# Patient Record
Sex: Male | Born: 1950 | Race: Black or African American | Hispanic: No | Marital: Married | Smoking: Former smoker
Health system: Southern US, Community
[De-identification: ages and names within clinical notes are randomized; demographics above are authoritative.]

## PROBLEM LIST (undated history)

## (undated) DIAGNOSIS — R0609 Other forms of dyspnea: Secondary | ICD-10-CM

## (undated) DIAGNOSIS — I10 Essential (primary) hypertension: Secondary | ICD-10-CM

## (undated) DIAGNOSIS — R058 Other specified cough: Secondary | ICD-10-CM

## (undated) DIAGNOSIS — R7303 Prediabetes: Secondary | ICD-10-CM

## (undated) DIAGNOSIS — M1990 Unspecified osteoarthritis, unspecified site: Secondary | ICD-10-CM

## (undated) DIAGNOSIS — R05 Cough: Secondary | ICD-10-CM

## (undated) DIAGNOSIS — K219 Gastro-esophageal reflux disease without esophagitis: Secondary | ICD-10-CM

## (undated) DIAGNOSIS — E119 Type 2 diabetes mellitus without complications: Secondary | ICD-10-CM

## (undated) DIAGNOSIS — G4730 Sleep apnea, unspecified: Secondary | ICD-10-CM

## (undated) DIAGNOSIS — R430 Anosmia: Secondary | ICD-10-CM

## (undated) DIAGNOSIS — E042 Nontoxic multinodular goiter: Secondary | ICD-10-CM

## (undated) DIAGNOSIS — I639 Cerebral infarction, unspecified: Secondary | ICD-10-CM

## (undated) DIAGNOSIS — C61 Malignant neoplasm of prostate: Secondary | ICD-10-CM

## (undated) DIAGNOSIS — C801 Malignant (primary) neoplasm, unspecified: Secondary | ICD-10-CM

## (undated) HISTORY — PX: FRACTURE SURGERY: SHX138

## (undated) HISTORY — PX: PARATHYROIDECTOMY: SHX19

---

## 2009-09-18 HISTORY — PX: BACK SURGERY: SHX140

## 2010-09-18 DIAGNOSIS — C801 Malignant (primary) neoplasm, unspecified: Secondary | ICD-10-CM

## 2010-09-18 HISTORY — DX: Malignant (primary) neoplasm, unspecified: C80.1

## 2014-02-04 ENCOUNTER — Other Ambulatory Visit: Payer: Self-pay | Admitting: Orthopedic Surgery

## 2014-02-04 NOTE — H&P (Signed)
Noah Jensen is an 63 y.o. male.   Chief Complaint: back and leg pain HPI:The patient is a 63 year old male who presents today for follow up of their back. The patient is being followed for their low back symptoms. They are now 2 years, 2 months out from injury (DOI 11/16/2011). Symptoms reported today include: pain, pain with lying and pain with standing. The patient states that they are doing poorly. Current treatment includes: activity modification and pain medications (prn). The following medication has been used for pain control: gabapentin. The patient reports their current pain level to be 6-7 / 10. The patient presents today following MRI. Note for "Follow-up back": The patient is currently out of work.  Noah Jensen is having right lower extremity radicular pain into the big toe. He said surgery has been approved. He has had a history of a lumbar decompression at L4-5 and L5-S1. He had some back but predominant leg pain. He is retired now. He would like to proceed with surgical intervention. He has had his lumbar MRI. 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 There is an asymmetric effect on the L5-S1 nerve root. He has foraminal stenosis noted.  He is here with his wife. He did get relief when he put his foot up on a step stool and leans forward.   No past medical history on file.  No past surgical history on file.  No family history on file. Social History:  has no tobacco, alcohol, and drug history on file.  Allergies: Allergies not on file   (Not in a hospital admission)  No results found for this or any previous visit (from the past 48 hour(s)). No results found.  Review of Systems  Constitutional: Negative.   HENT: Negative.   Eyes: Negative.   Respiratory: Negative.   Cardiovascular: Negative.   Gastrointestinal: Negative.   Genitourinary: Negative.   Musculoskeletal: Positive for back pain.  Skin: Negative.   Neurological: Positive for sensory change and focal  weakness.  Psychiatric/Behavioral: Negative.     There were no vitals taken for this visit. Physical Exam  Constitutional: He is oriented to person, place, and time. He appears well-developed and well-nourished.  HENT:  Head: Normocephalic and atraumatic.  Eyes: Conjunctivae and EOM are normal. Pupils are equal, round, and reactive to light.  Neck: Normal range of motion. Neck supple.  Cardiovascular: Normal rate and regular rhythm.   Respiratory: Effort normal and breath sounds normal.  GI: Soft. Bowel sounds are normal.  Musculoskeletal:  He is in mild distress. He is standing with his foot up on a stool.  Lumbar spine exam reveals no evidence of soft tissue swelling, ecchymosis or deformity. The abdomen is soft and nontender. Nontender over the trochanters. No cellulitis or lymphadenopathy.  Straight leg raise produces buttock, thigh and calf pain. He has EHL weakness. Altered sensation in the L5 dermatome. There is some discomfort in the lumbosacral junction with forward flexion and extension. Patient is normoreflexic. There is no Babinski or clonus. Sensory exam is intact to light touch. The patient has good distal pulses. No DVT. No pain and normal range of motion without instability of the hips, knees and ankles.  Neurological: He is alert and oriented to person, place, and time. He has normal reflexes.  Skin: Skin is warm and dry.  Psychiatric: He has a normal mood and affect.    He has had his lumbar MRI. 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 There is an  asymmetric effect on the L5-S1 nerve root. He has foraminal stenosis noted.  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 History of lumbar decompression at L4-5 and L5-S1 with slight hypertrophy.  We discussed options at this point to include:  1. Living with the symptoms. 2. Consider a foraminotomy, partial full fasciectomy with a lateral  mass fusion versus two and possibly three level fusion.  We discussed that he is not challenged by his back pain, it is predominantly his leg pain. He had an acute injury. This was confirmed by a Nerve Conduction Study as opposed to chronic. He does have foraminal stenosis which would require a partial fasciectomy to full. I described that in detail.  I had an extensive discussion of the risks and benefits of the lumbar decompression with the patient including bleeding, infection, damage to neurovascular structures, epidural fibrosis, CSF leak requiring repair. We also discussed increase in pain, adjacent segment disease, recurrent disc herniation, need for future surgery including repeat decompression and/or fusion. We also discussed risks of postoperative hematoma, paralysis, anesthetic complications including DVT, PE, death, cardiopulmonary dysfunction. In addition, the perioperative and postoperative courses were discussed in detail including the rehabilitative time and return to functional activity and work. I provided the patient with an illustrated handout and utilized the appropriate surgical models.  I indicated with the duration of time over two years he may undergo a decompression without avail or worsening symptoms. Considering the slight improvement that he has had , the is ability to compensate and unload the nerve root may improve his prognosis.  We discussed the possibility of a decompression for a multi-level fusion in the future. We spent considerable time discussing this. We will proceed accordingly. He has otherwise been healthy. Again,there has been considerable time from the initial indication. We may consider a repeat Nerve Conduction Study at some point.  Plan microlumbar decompression L5-S1 right, possible lateral mass fusion  Jaclyn M. Bissell PA-C for Dr. Tonita Cong 02/04/2014, 8:42 PM

## 2014-02-05 ENCOUNTER — Other Ambulatory Visit: Payer: Self-pay | Admitting: Orthopedic Surgery

## 2014-02-05 ENCOUNTER — Other Ambulatory Visit (HOSPITAL_COMMUNITY): Payer: Self-pay | Admitting: Specialist

## 2014-02-05 ENCOUNTER — Encounter (HOSPITAL_COMMUNITY): Payer: Self-pay | Admitting: Pharmacy Technician

## 2014-02-05 NOTE — Patient Instructions (Addendum)
Your procedure is scheduled on:  02/11/14  [facility]  Report to [place] at 0800      AM.  Call this number if you have problems the morning of surgery: [phone]        Do not eat food  Or drink :After Midnight. Tuesday NIGHT   Take these medicines the morning of surgery with A SIP OF WATER: FINASTERIDE,NEXIUM DO NOT TAKE ANY METFORMIN MORNING OF SURGERY  .  Contacts, dentures or partial plates, or metal hairpins  can not be worn to surgery. Your family will be responsible for glasses, dentures, hearing aides while you are in surgery  Leave suitcase in the car. After surgery it may be brought to your room.  For patients admitted to the hospital, checkout time is 11:00 AM day of  discharge.                DO NOT WEAR JEWELRY, LOTIONS, POWDERS, OR PERFUMES.  WOMEN-- DO NOT SHAVE LEGS OR UNDERARMS FOR 48 HOURS BEFORE SHOWERS. MEN MAY SHAVE FACE.  Patients discharged the day of surgery will not be allowed to drive home. IF going home the day of surgery, you must have a driver and someone to stay with you for the first 24 hours                                                                  [facility] - Preparing for Surgery Before surgery, you can play an important role.  Because skin is not sterile, your skin needs to be as free of germs as possible.  You can reduce the number of germs on your skin by washing with CHG (chlorahexidine gluconate) soap before surgery.  CHG is an antiseptic cleaner which kills germs and bonds with the skin to continue killing germs even after washing. Please DO NOT use if you have an allergy to CHG or antibacterial soaps.  If your skin becomes reddened/irritated stop using the CHG and inform your nurse when you arrive at Short Stay. Do not shave (including legs and underarms) for at least 48 hours prior to the first CHG shower.  You may shave your face/neck. Please follow these instructions carefully:  1.  Shower with CHG Soap the  night before surgery and the  morning of Surgery.  2.  If you choose to wash your hair, wash your hair first as usual with your  normal  shampoo.  3.  After you shampoo, rinse your hair and body thoroughly to remove the  shampoo.                           4.  Use CHG as you would any other liquid soap.  You can apply chg directly  to the skin and wash                       Gently with a scrungie or clean washcloth.  5.  Apply the CHG Soap to your body ONLY FROM THE NECK DOWN.   Do not use on face/ open  Wound or open sores. Avoid contact with eyes, ears mouth and genitals (private parts).                       Wash face,  Genitals (private parts) with your normal soap.             6.  Wash thoroughly, paying special attention to the area where your surgery  will be performed.  7.  Thoroughly rinse your body with warm water from the neck down.  8.  DO NOT shower/wash with your normal soap after using and rinsing off  the CHG Soap.                9.  Pat yourself dry with a clean towel.            10.  Wear clean pajamas.            11.  Place clean sheets on your bed the night of your first shower and do not  sleep with pets. Day of Surgery : Do not apply any lotions/deodorants the morning of surgery.  Please wear clean clothes to the hospital/surgery center.  FAILURE TO FOLLOW THESE INSTRUCTIONS MAY RESULT IN THE CANCELLATION OF YOUR SURGERY PATIENT SIGNATURE_________________________________  NURSE SIGNATURE__________________________________  ________________________________________________________________________   Noah Jensen  An incentive spirometer is a tool that can help keep your lungs clear and active. This tool measures how well you are filling your lungs with each breath. Taking long deep breaths may help reverse or decrease the chance of developing breathing (pulmonary) problems (especially infection) following:  A long period of time when you  are unable to move or be active. BEFORE THE PROCEDURE   If the spirometer includes an indicator to show your best effort, your nurse or respiratory therapist will set it to a desired goal.  If possible, sit up straight or lean slightly forward. Try not to slouch.  Hold the incentive spirometer in an upright position. INSTRUCTIONS FOR USE  1. Sit on the edge of your bed if possible, or sit up as far as you can in bed or on a chair. 2. Hold the incentive spirometer in an upright position. 3. Breathe out normally. 4. Place the mouthpiece in your mouth and seal your lips tightly around it. 5. Breathe in slowly and as deeply as possible, raising the piston or the ball toward the top of the column. 6. Hold your breath for 3-5 seconds or for as long as possible. Allow the piston or ball to fall to the bottom of the column. 7. Remove the mouthpiece from your mouth and breathe out normally. 8. Rest for a few seconds and repeat Steps 1 through 7 at least 10 times every 1-2 hours when you are awake. Take your time and take a few normal breaths between deep breaths. 9. The spirometer may include an indicator to show your best effort. Use the indicator as a goal to work toward during each repetition. 10. After each set of 10 deep breaths, practice coughing to be sure your lungs are clear. If you have an incision (the cut made at the time of surgery), support your incision when coughing by placing a pillow or rolled up towels firmly against it. Once you are able to get out of bed, walk around indoors and cough well. You may stop using the incentive spirometer when instructed by your caregiver.  RISKS AND COMPLICATIONS  Take your time so you do not get  dizzy or light-headed.  If you are in pain, you may need to take or ask for pain medication before doing incentive spirometry. It is harder to take a deep breath if you are having pain. AFTER USE  Rest and breathe slowly and easily.  It can be helpful to  keep track of a log of your progress. Your caregiver can provide you with a simple table to help with this. If you are using the spirometer at home, follow these instructions: [place] IF:   You are having difficultly using the spirometer.  You have trouble using the spirometer as often as instructed.  Your pain medication is not giving enough relief while using the spirometer.  You develop fever of 100.5 F (38.1 C) or higher. SEEK IMMEDIATE MEDICAL CARE IF:   You cough up bloody sputum that had not been present before.  You develop fever of 102 F (38.9 C) or greater.  You develop worsening pain at or near the incision site. MAKE SURE YOU:   Understand these instructions.  Will watch your condition.  Will get help right away if you are not doing well or get worse. Document Released: 01/15/2007 Document Revised: 11/27/2011 Document Reviewed: 03/18/2007 [facility] Patient Information 2014 [place], [place].   ________________________________________________________________________

## 2014-02-05 NOTE — Progress Notes (Signed)
Lacie Draft, PA  - Please enter preop orders in Epic for Noah Jensen - surg is 5/27 - pt is coming to [facility] tomorrow Friday 5/22 for preop.  Thanks.

## 2014-02-06 ENCOUNTER — Ambulatory Visit (HOSPITAL_COMMUNITY)
Admission: RE | Admit: 2014-02-06 | Discharge: 2014-02-06 | Disposition: A | Discharge status: Home / Self Care | Payer: Worker's Compensation | Source: Ambulatory Visit | Attending: Orthopedic Surgery | Admitting: Orthopedic Surgery

## 2014-02-06 ENCOUNTER — Encounter (HOSPITAL_COMMUNITY)
Admission: RE | Admit: 2014-02-06 | Discharge: 2014-02-06 | Disposition: A | Discharge status: Home / Self Care | Payer: Worker's Compensation | Source: Ambulatory Visit | Attending: Specialist | Admitting: Specialist

## 2014-02-06 ENCOUNTER — Encounter (HOSPITAL_COMMUNITY): Payer: Self-pay

## 2014-02-06 ENCOUNTER — Encounter (INDEPENDENT_AMBULATORY_CARE_PROVIDER_SITE_OTHER): Payer: Self-pay

## 2014-02-06 DIAGNOSIS — Z01818 Encounter for other preprocedural examination: Secondary | ICD-10-CM | POA: Insufficient documentation

## 2014-02-06 DIAGNOSIS — M47817 Spondylosis without myelopathy or radiculopathy, lumbosacral region: Secondary | ICD-10-CM | POA: Insufficient documentation

## 2014-02-06 DIAGNOSIS — Z01810 Encounter for preprocedural cardiovascular examination: Secondary | ICD-10-CM | POA: Insufficient documentation

## 2014-02-06 DIAGNOSIS — R058 Other specified cough: Secondary | ICD-10-CM

## 2014-02-06 DIAGNOSIS — J984 Other disorders of lung: Secondary | ICD-10-CM | POA: Insufficient documentation

## 2014-02-06 DIAGNOSIS — M48061 Spinal stenosis, lumbar region without neurogenic claudication: Secondary | ICD-10-CM | POA: Insufficient documentation

## 2014-02-06 DIAGNOSIS — Z01812 Encounter for preprocedural laboratory examination: Secondary | ICD-10-CM | POA: Insufficient documentation

## 2014-02-06 DIAGNOSIS — Z87891 Personal history of nicotine dependence: Secondary | ICD-10-CM | POA: Insufficient documentation

## 2014-02-06 HISTORY — DX: Cough: R05

## 2014-02-06 HISTORY — DX: Malignant (primary) neoplasm, unspecified: C80.1

## 2014-02-06 HISTORY — DX: Other specified cough: R05.8

## 2014-02-06 HISTORY — DX: Prediabetes: R73.03

## 2014-02-06 HISTORY — DX: Unspecified osteoarthritis, unspecified site: M19.90

## 2014-02-06 HISTORY — DX: Gastro-esophageal reflux disease without esophagitis: K21.9

## 2014-02-06 LAB — BASIC METABOLIC PANEL
BUN: 16 mg/dL (ref 6–23)
CALCIUM: 10.5 mg/dL (ref 8.4–10.5)
CHLORIDE: 100 meq/L (ref 96–112)
CO2: 28 mEq/L (ref 19–32)
CREATININE: 1.1 mg/dL (ref 0.50–1.35)
GFR calc Af Amer: 82 mL/min — ABNORMAL LOW (ref >90)
GFR calc non Af Amer: 71 mL/min — ABNORMAL LOW (ref >90)
Glucose, Bld: 120 mg/dL — ABNORMAL HIGH (ref 70–99)
Potassium: 4.1 mEq/L (ref 3.7–5.3)
Sodium: 142 mEq/L (ref 137–147)

## 2014-02-06 LAB — CBC
HCT: 46.1 % (ref 39.0–52.0)
Hemoglobin: 15.6 g/dL (ref 13.0–17.0)
MCH: 30.1 pg (ref 26.0–34.0)
MCHC: 33.8 g/dL (ref 30.0–36.0)
MCV: 89 fL (ref 78.0–100.0)
PLATELETS: 199 10*3/uL (ref 150–400)
RBC: 5.18 MIL/uL (ref 4.22–5.81)
RDW: 13.4 % (ref 11.5–15.5)
WBC: 5.4 10*3/uL (ref 4.0–10.5)

## 2014-02-06 LAB — SURGICAL PCR SCREEN
MRSA, PCR: POSITIVE — AB
Staphylococcus aureus: POSITIVE — AB

## 2014-02-06 NOTE — Progress Notes (Signed)
Faxed positive MRSA  PCR screen to Dr Tonita Cong via [facility]

## 2014-02-06 NOTE — Progress Notes (Signed)
02/06/14 0928  OBSTRUCTIVE SLEEP APNEA  Have you ever been diagnosed with sleep apnea through a sleep study? No  Do you snore loudly (loud enough to be heard through closed doors)?  0  Do you often feel tired, fatigued, or sleepy during the daytime? 1  Has anyone observed you stop breathing during your sleep? 0  Do you have, or are you being treated for high blood pressure? 0  BMI more than 35 kg/m2? 0  Age over 62 years old? 1  Neck circumference greater than 40 cm/16 inches? 1  Gender: 1  Obstructive Sleep Apnea Score 4  Score 4 or greater  Results sent to PCP

## 2014-02-10 ENCOUNTER — Other Ambulatory Visit: Payer: Self-pay | Admitting: Orthopedic Surgery

## 2014-02-11 ENCOUNTER — Ambulatory Visit (HOSPITAL_COMMUNITY): Payer: Worker's Compensation

## 2014-02-11 ENCOUNTER — Ambulatory Visit (HOSPITAL_COMMUNITY): Payer: Worker's Compensation | Admitting: Anesthesiology

## 2014-02-11 ENCOUNTER — Encounter (HOSPITAL_COMMUNITY)
Admission: RE | Disposition: A | Discharge status: Home Health | Payer: Self-pay | Source: Ambulatory Visit | Attending: Specialist

## 2014-02-11 ENCOUNTER — Ambulatory Visit (HOSPITAL_COMMUNITY)
Admission: RE | Admit: 2014-02-11 | Discharge: 2014-02-12 | Disposition: A | Discharge status: Home Health | Payer: Worker's Compensation | Source: Ambulatory Visit | Attending: Specialist | Admitting: Specialist

## 2014-02-11 ENCOUNTER — Encounter (HOSPITAL_COMMUNITY): Payer: Self-pay | Admitting: *Deleted

## 2014-02-11 ENCOUNTER — Encounter (HOSPITAL_COMMUNITY): Payer: Worker's Compensation | Admitting: Anesthesiology

## 2014-02-11 DIAGNOSIS — K219 Gastro-esophageal reflux disease without esophagitis: Secondary | ICD-10-CM | POA: Insufficient documentation

## 2014-02-11 DIAGNOSIS — M51379 Other intervertebral disc degeneration, lumbosacral region without mention of lumbar back pain or lower extremity pain: Secondary | ICD-10-CM | POA: Insufficient documentation

## 2014-02-11 DIAGNOSIS — M5416 Radiculopathy, lumbar region: Secondary | ICD-10-CM

## 2014-02-11 DIAGNOSIS — M48061 Spinal stenosis, lumbar region without neurogenic claudication: Secondary | ICD-10-CM | POA: Diagnosis present

## 2014-02-11 DIAGNOSIS — M5137 Other intervertebral disc degeneration, lumbosacral region: Secondary | ICD-10-CM | POA: Insufficient documentation

## 2014-02-11 DIAGNOSIS — C61 Malignant neoplasm of prostate: Secondary | ICD-10-CM | POA: Insufficient documentation

## 2014-02-11 DIAGNOSIS — Z87891 Personal history of nicotine dependence: Secondary | ICD-10-CM | POA: Insufficient documentation

## 2014-02-11 DIAGNOSIS — M5126 Other intervertebral disc displacement, lumbar region: Secondary | ICD-10-CM | POA: Insufficient documentation

## 2014-02-11 DIAGNOSIS — E119 Type 2 diabetes mellitus without complications: Secondary | ICD-10-CM | POA: Insufficient documentation

## 2014-02-11 HISTORY — PX: LUMBAR LAMINECTOMY/DECOMPRESSION MICRODISCECTOMY: SHX5026

## 2014-02-11 HISTORY — DX: Type 2 diabetes mellitus without complications: E11.9

## 2014-02-11 LAB — GLUCOSE, CAPILLARY
GLUCOSE-CAPILLARY: 108 mg/dL — AB (ref 70–99)
Glucose-Capillary: 104 mg/dL — ABNORMAL HIGH (ref 70–99)
Glucose-Capillary: 81 mg/dL (ref 70–99)
Glucose-Capillary: 151 mg/dL — ABNORMAL HIGH (ref 70–99)

## 2014-02-11 LAB — ABO/RH: ABO/RH(D): B POS

## 2014-02-11 LAB — TYPE AND SCREEN
ABO/RH(D): B POS
ANTIBODY SCREEN: NEGATIVE

## 2014-02-11 SURGERY — LUMBAR LAMINECTOMY/DECOMPRESSION MICRODISCECTOMY 1 LEVEL
Anesthesia: General | Site: Back | Laterality: Right

## 2014-02-11 MED ORDER — SODIUM CHLORIDE 0.9 % IJ SOLN: 3.0000 mL | INTRAMUSCULAR | Status: DC | PRN

## 2014-02-11 MED ORDER — SODIUM CHLORIDE 0.9 % IJ SOLN
INTRAMUSCULAR | Status: AC
  Filled 2014-02-11: qty 10

## 2014-02-11 MED ORDER — FENTANYL CITRATE 0.05 MG/ML IJ SOLN
INTRAMUSCULAR | Status: AC
  Filled 2014-02-11: qty 2

## 2014-02-11 MED ORDER — NEOSTIGMINE METHYLSULFATE 10 MG/10ML IV SOLN
INTRAVENOUS | Status: DC | PRN
  Administered 2014-02-11: 5 mg via INTRAVENOUS

## 2014-02-11 MED ORDER — OXYCODONE-ACETAMINOPHEN 5-325 MG PO TABS
1.0000 | ORAL_TABLET | ORAL | Status: DC | PRN
  Administered 2014-02-11 – 2014-02-12 (×5): 2 via ORAL
  Filled 2014-02-11 (×5): qty 2

## 2014-02-11 MED ORDER — LACTATED RINGERS IV SOLN: INTRAVENOUS | Status: DC

## 2014-02-11 MED ORDER — LACTATED RINGERS IV SOLN
INTRAVENOUS | Status: DC | PRN
  Administered 2014-02-11 (×3): via INTRAVENOUS

## 2014-02-11 MED ORDER — BUPIVACAINE-EPINEPHRINE 0.5% -1:200000 IJ SOLN
INTRAMUSCULAR | Status: DC | PRN
  Administered 2014-02-11: 10 mL

## 2014-02-11 MED ORDER — EPHEDRINE SULFATE 50 MG/ML IJ SOLN
INTRAMUSCULAR | Status: AC
  Filled 2014-02-11: qty 1

## 2014-02-11 MED ORDER — HYDROMORPHONE HCL PF 1 MG/ML IJ SOLN
INTRAMUSCULAR | Status: AC
  Filled 2014-02-11: qty 1

## 2014-02-11 MED ORDER — SODIUM CHLORIDE 0.9 % IV SOLN: 250.0000 mL | INTRAVENOUS | Status: DC

## 2014-02-11 MED ORDER — ACETAMINOPHEN 10 MG/ML IV SOLN
INTRAVENOUS | Status: DC | PRN
  Administered 2014-02-11: 1000 mg via INTRAVENOUS

## 2014-02-11 MED ORDER — PHENOL 1.4 % MT LIQD: 1.0000 | OROMUCOSAL | Status: DC | PRN

## 2014-02-11 MED ORDER — THROMBIN 5000 UNITS EX SOLR
CUTANEOUS | Status: AC
  Filled 2014-02-11: qty 10000

## 2014-02-11 MED ORDER — CEFAZOLIN SODIUM-DEXTROSE 2-3 GM-% IV SOLR
2.0000 g | INTRAVENOUS | Status: AC
  Administered 2014-02-11: 2 g via INTRAVENOUS

## 2014-02-11 MED ORDER — GLYCOPYRROLATE 0.2 MG/ML IJ SOLN
INTRAMUSCULAR | Status: AC
  Filled 2014-02-11: qty 3

## 2014-02-11 MED ORDER — MIDAZOLAM HCL 2 MG/2ML IJ SOLN
INTRAMUSCULAR | Status: AC
  Filled 2014-02-11: qty 2

## 2014-02-11 MED ORDER — HYDROMORPHONE HCL PF 1 MG/ML IJ SOLN
0.2500 mg | INTRAMUSCULAR | Status: DC | PRN
  Administered 2014-02-11 (×4): 0.5 mg via INTRAVENOUS

## 2014-02-11 MED ORDER — SODIUM CHLORIDE 0.9 % IR SOLN
Status: DC | PRN
  Administered 2014-02-11: 11:00:00

## 2014-02-11 MED ORDER — VITAMIN E 45 MG (100 UNIT) PO CAPS: 1000.0000 [IU] | ORAL_CAPSULE | Freq: Every day | ORAL | Status: DC

## 2014-02-11 MED ORDER — ACETAMINOPHEN 650 MG RE SUPP: 650.0000 mg | RECTAL | Status: DC | PRN

## 2014-02-11 MED ORDER — DEXAMETHASONE SODIUM PHOSPHATE 4 MG/ML IJ SOLN
INTRAMUSCULAR | Status: DC | PRN
  Administered 2014-02-11: 10 mg via INTRAVENOUS

## 2014-02-11 MED ORDER — ONDANSETRON HCL 4 MG/2ML IJ SOLN
4.0000 mg | INTRAMUSCULAR | Status: DC | PRN
  Filled 2014-02-11: qty 2

## 2014-02-11 MED ORDER — SODIUM CHLORIDE 0.9 % IJ SOLN
3.0000 mL | Freq: Two times a day (BID) | INTRAMUSCULAR | Status: DC
  Administered 2014-02-12: 3 mL via INTRAVENOUS

## 2014-02-11 MED ORDER — PHENYLEPHRINE HCL 10 MG/ML IJ SOLN
INTRAMUSCULAR | Status: DC | PRN
  Administered 2014-02-11: 20 ug via INTRAVENOUS
  Administered 2014-02-11 (×3): 40 ug via INTRAVENOUS
  Administered 2014-02-11: 20 ug via INTRAVENOUS
  Administered 2014-02-11 (×2): 40 ug via INTRAVENOUS

## 2014-02-11 MED ORDER — LACTATED RINGERS IV SOLN
INTRAVENOUS | Status: DC
  Administered 2014-02-11: 1000 mL via INTRAVENOUS

## 2014-02-11 MED ORDER — HYDROCODONE-ACETAMINOPHEN 5-325 MG PO TABS: 1.0000 | ORAL_TABLET | ORAL | Status: DC | PRN

## 2014-02-11 MED ORDER — DOCUSATE SODIUM 100 MG PO CAPS
100.0000 mg | ORAL_CAPSULE | Freq: Two times a day (BID) | ORAL | Status: DC
Start: 2014-02-11 — End: 2014-02-12
  Administered 2014-02-11 – 2014-02-12 (×2): 100 mg via ORAL

## 2014-02-11 MED ORDER — VITAMIN B-12 1000 MCG PO TABS
1000.0000 ug | ORAL_TABLET | Freq: Every day | ORAL | Status: DC
  Administered 2014-02-12: 1000 ug via ORAL
  Filled 2014-02-11: qty 1

## 2014-02-11 MED ORDER — PROPOFOL 10 MG/ML IV BOLUS
INTRAVENOUS | Status: DC | PRN
  Administered 2014-02-11: 200 mg via INTRAVENOUS

## 2014-02-11 MED ORDER — FINASTERIDE 5 MG PO TABS
5.0000 mg | ORAL_TABLET | Freq: Every evening | ORAL | Status: DC
  Administered 2014-02-11: 5 mg via ORAL
  Filled 2014-02-11 (×2): qty 1

## 2014-02-11 MED ORDER — MIDAZOLAM HCL 5 MG/5ML IJ SOLN
INTRAMUSCULAR | Status: DC | PRN
  Administered 2014-02-11: 1 mg via INTRAVENOUS

## 2014-02-11 MED ORDER — CISATRACURIUM BESYLATE 20 MG/10ML IV SOLN
INTRAVENOUS | Status: AC
  Filled 2014-02-11: qty 10

## 2014-02-11 MED ORDER — PANTOPRAZOLE SODIUM 40 MG PO TBEC
80.0000 mg | DELAYED_RELEASE_TABLET | Freq: Every day | ORAL | Status: DC
  Administered 2014-02-12: 80 mg via ORAL
  Filled 2014-02-11 (×2): qty 2

## 2014-02-11 MED ORDER — METHOCARBAMOL 500 MG PO TABS
500.0000 mg | ORAL_TABLET | Freq: Four times a day (QID) | ORAL | Status: DC | PRN
  Administered 2014-02-11 – 2014-02-12 (×3): 500 mg via ORAL
  Filled 2014-02-11 (×3): qty 1

## 2014-02-11 MED ORDER — KETAMINE HCL 10 MG/ML IJ SOLN
INTRAMUSCULAR | Status: AC
  Filled 2014-02-11: qty 1

## 2014-02-11 MED ORDER — LIDOCAINE HCL (CARDIAC) 20 MG/ML IV SOLN
INTRAVENOUS | Status: DC | PRN
  Administered 2014-02-11: 30 mg via INTRAVENOUS

## 2014-02-11 MED ORDER — METHOCARBAMOL 1000 MG/10ML IJ SOLN
500.0000 mg | Freq: Four times a day (QID) | INTRAMUSCULAR | Status: DC | PRN
  Administered 2014-02-11: 500 mg via INTRAVENOUS
  Filled 2014-02-11: qty 5

## 2014-02-11 MED ORDER — PROPOFOL 10 MG/ML IV BOLUS
INTRAVENOUS | Status: AC
  Filled 2014-02-11: qty 20

## 2014-02-11 MED ORDER — ACETAMINOPHEN 10 MG/ML IV SOLN
1000.0000 mg | Freq: Once | INTRAVENOUS | Status: DC
Start: 2014-02-11 — End: 2014-02-11
  Filled 2014-02-11: qty 100

## 2014-02-11 MED ORDER — HYDROMORPHONE HCL PF 1 MG/ML IJ SOLN
0.5000 mg | INTRAMUSCULAR | Status: DC | PRN
  Administered 2014-02-11: 1 mg via INTRAVENOUS
  Filled 2014-02-11: qty 1

## 2014-02-11 MED ORDER — FENTANYL CITRATE 0.05 MG/ML IJ SOLN
INTRAMUSCULAR | Status: AC
  Filled 2014-02-11: qty 5

## 2014-02-11 MED ORDER — INSULIN ASPART 100 UNIT/ML ~~LOC~~ SOLN: 0.0000 [IU] | Freq: Three times a day (TID) | SUBCUTANEOUS | Status: DC

## 2014-02-11 MED ORDER — KETAMINE HCL 10 MG/ML IJ SOLN
INTRAMUSCULAR | Status: DC | PRN
  Administered 2014-02-11: 25 mg via INTRAVENOUS

## 2014-02-11 MED ORDER — SUCCINYLCHOLINE CHLORIDE 20 MG/ML IJ SOLN
INTRAMUSCULAR | Status: DC | PRN
  Administered 2014-02-11: 150 mg via INTRAVENOUS

## 2014-02-11 MED ORDER — CEFAZOLIN SODIUM-DEXTROSE 2-3 GM-% IV SOLR
2.0000 g | Freq: Three times a day (TID) | INTRAVENOUS | Status: AC
  Administered 2014-02-11 – 2014-02-12 (×2): 2 g via INTRAVENOUS
  Filled 2014-02-11 (×2): qty 50

## 2014-02-11 MED ORDER — CEFAZOLIN SODIUM-DEXTROSE 2-3 GM-% IV SOLR
INTRAVENOUS | Status: AC
  Filled 2014-02-11: qty 50

## 2014-02-11 MED ORDER — GLYCOPYRROLATE 0.2 MG/ML IJ SOLN
INTRAMUSCULAR | Status: DC | PRN
  Administered 2014-02-11: 0.6 mg via INTRAVENOUS

## 2014-02-11 MED ORDER — ACETAMINOPHEN 325 MG PO TABS: 650.0000 mg | ORAL_TABLET | ORAL | Status: DC | PRN

## 2014-02-11 MED ORDER — ONDANSETRON HCL 4 MG/2ML IJ SOLN
INTRAMUSCULAR | Status: DC | PRN
  Administered 2014-02-11: 4 mg via INTRAVENOUS

## 2014-02-11 MED ORDER — MENTHOL 3 MG MT LOZG: 1.0000 | LOZENGE | OROMUCOSAL | Status: DC | PRN

## 2014-02-11 MED ORDER — CISATRACURIUM BESYLATE (PF) 10 MG/5ML IV SOLN
INTRAVENOUS | Status: DC | PRN
  Administered 2014-02-11: 2 mg via INTRAVENOUS
  Administered 2014-02-11: 1 mg via INTRAVENOUS
  Administered 2014-02-11: 6 mg via INTRAVENOUS
  Administered 2014-02-11: 2 mg via INTRAVENOUS

## 2014-02-11 MED ORDER — BUPIVACAINE-EPINEPHRINE (PF) 0.5% -1:200000 IJ SOLN
INTRAMUSCULAR | Status: AC
  Filled 2014-02-11: qty 30

## 2014-02-11 MED ORDER — FENTANYL CITRATE 0.05 MG/ML IJ SOLN
INTRAMUSCULAR | Status: DC | PRN
  Administered 2014-02-11: 100 ug via INTRAVENOUS
  Administered 2014-02-11 (×5): 25 ug via INTRAVENOUS
  Administered 2014-02-11 (×2): 50 ug via INTRAVENOUS
  Administered 2014-02-11 (×3): 25 ug via INTRAVENOUS
  Administered 2014-02-11: 50 ug via INTRAVENOUS

## 2014-02-11 MED ORDER — VITAMIN E 180 MG (400 UNIT) PO CAPS
400.0000 [IU] | ORAL_CAPSULE | Freq: Every day | ORAL | Status: DC
  Administered 2014-02-12: 400 [IU] via ORAL
  Filled 2014-02-11: qty 1

## 2014-02-11 MED ORDER — EPHEDRINE SULFATE 50 MG/ML IJ SOLN
INTRAMUSCULAR | Status: DC | PRN
  Administered 2014-02-11: 5 mg via INTRAVENOUS

## 2014-02-11 MED ORDER — SODIUM CHLORIDE 0.45 % IV SOLN
INTRAVENOUS | Status: DC
  Administered 2014-02-11: 17:00:00 via INTRAVENOUS

## 2014-02-11 MED ORDER — HYDROMORPHONE HCL PF 1 MG/ML IJ SOLN
0.5000 mg | INTRAMUSCULAR | Status: DC | PRN
  Administered 2014-02-11 (×2): 0.5 mg via INTRAVENOUS

## 2014-02-11 MED ORDER — VANCOMYCIN HCL 10 G IV SOLR
1500.0000 mg | INTRAVENOUS | Status: AC
  Administered 2014-02-11: 1500 mg via INTRAVENOUS
  Filled 2014-02-11: qty 1500

## 2014-02-11 SURGICAL SUPPLY — 48 items
BAG ZIPLOCK 12X15 (MISCELLANEOUS) IMPLANT
CLEANER TIP ELECTROSURG 2X2 (MISCELLANEOUS) ×3 IMPLANT
CLOSURE WOUND 1/2 X4 (GAUZE/BANDAGES/DRESSINGS) ×1
CLOTH 2% CHLOROHEXIDINE 3PK (PERSONAL CARE ITEMS) ×3 IMPLANT
DRAPE MICROSCOPE LEICA (MISCELLANEOUS) ×3 IMPLANT
DRAPE POUCH INSTRU U-SHP 10X18 (DRAPES) ×3 IMPLANT
DRAPE SURG 17X11 SM STRL (DRAPES) ×3 IMPLANT
DRAPE UTILITY XL STRL (DRAPES) ×3 IMPLANT
DRSG AQUACEL AG ADV 3.5X 4 (GAUZE/BANDAGES/DRESSINGS) IMPLANT
DRSG AQUACEL AG ADV 3.5X 6 (GAUZE/BANDAGES/DRESSINGS) ×3 IMPLANT
DURAPREP 26ML APPLICATOR (WOUND CARE) ×3 IMPLANT
DURASEAL SPINE SEALANT 3ML (MISCELLANEOUS) IMPLANT
ELECT BLADE TIP CTD 4 INCH (ELECTRODE) IMPLANT
ELECT REM PT RETURN 9FT ADLT (ELECTROSURGICAL) ×3
ELECTRODE REM PT RTRN 9FT ADLT (ELECTROSURGICAL) ×1 IMPLANT
GLOVE BIOGEL PI IND STRL 7.5 (GLOVE) ×1 IMPLANT
GLOVE BIOGEL PI INDICATOR 7.5 (GLOVE) ×2
GLOVE SURG SS PI 7.5 STRL IVOR (GLOVE) ×3 IMPLANT
GLOVE SURG SS PI 8.0 STRL IVOR (GLOVE) ×6 IMPLANT
GOWN SPEC L3 XXLG W/TWL (GOWN DISPOSABLE) ×3 IMPLANT
GOWN STRL NON-REIN LRG LVL3 (GOWN DISPOSABLE) ×9 IMPLANT
GOWN STRL REUS W/TWL XL LVL3 (GOWN DISPOSABLE) ×6 IMPLANT
IV CATH 14GX2 1/4 (CATHETERS) ×3 IMPLANT
KIT BASIN OR (CUSTOM PROCEDURE TRAY) ×3 IMPLANT
KIT POSITIONING SURG ANDREWS (MISCELLANEOUS) ×3 IMPLANT
MANIFOLD NEPTUNE II (INSTRUMENTS) ×3 IMPLANT
NEEDLE SPNL 18GX3.5 QUINCKE PK (NEEDLE) ×6 IMPLANT
PATTIES SURGICAL .5 X.5 (GAUZE/BANDAGES/DRESSINGS) ×3 IMPLANT
PATTIES SURGICAL .75X.75 (GAUZE/BANDAGES/DRESSINGS) IMPLANT
PATTIES SURGICAL 1X1 (DISPOSABLE) IMPLANT
SPONGE SURGIFOAM ABS GEL 100 (HEMOSTASIS) ×3 IMPLANT
STAPLER VISISTAT (STAPLE) IMPLANT
STRIP CLOSURE SKIN 1/2X4 (GAUZE/BANDAGES/DRESSINGS) ×2 IMPLANT
SUT NURALON 4 0 TR CR/8 (SUTURE) IMPLANT
SUT PROLENE 3 0 PS 2 (SUTURE) ×3 IMPLANT
SUT VIC AB 1 CT1 27 (SUTURE)
SUT VIC AB 1 CT1 27XBRD ANTBC (SUTURE) IMPLANT
SUT VIC AB 1-0 CT2 27 (SUTURE) ×3 IMPLANT
SUT VIC AB 2-0 CT1 27 (SUTURE)
SUT VIC AB 2-0 CT1 TAPERPNT 27 (SUTURE) IMPLANT
SUT VIC AB 2-0 CT2 27 (SUTURE) ×3 IMPLANT
SYR 3ML LL SCALE MARK (SYRINGE) ×3 IMPLANT
TOWEL OR 17X26 10 PK STRL BLUE (TOWEL DISPOSABLE) ×3 IMPLANT
TOWEL OR NON WOVEN STRL DISP B (DISPOSABLE) IMPLANT
TRAY FOLEY CATH 16FRSI W/METER (SET/KITS/TRAYS/PACK) ×3 IMPLANT
TRAY LAMINECTOMY (CUSTOM PROCEDURE TRAY) ×3 IMPLANT
YANKAUER SUCT BULB TIP 10FT TU (MISCELLANEOUS) ×3 IMPLANT
YANKAUER SUCT BULB TIP NO VENT (SUCTIONS) IMPLANT

## 2014-02-11 NOTE — H&P (View-Only) (Signed)
Noah Jensen is an 63 y.o. male.   Chief Complaint: back and leg pain HPI:The patient is a 63 year old male who presents today for follow up of their back. The patient is being followed for their low back symptoms. They are now 2 years, 2 months out from injury (DOI 11/16/2011). Symptoms reported today include: pain, pain with lying and pain with standing. The patient states that they are doing poorly. Current treatment includes: activity modification and pain medications (prn). The following medication has been used for pain control: gabapentin. The patient reports their current pain level to be 6-7 / 10. The patient presents today following MRI. Note for "Follow-up back": The patient is currently out of work.  Noah Jensen is having right lower extremity radicular pain into the big toe. He said surgery has been approved. He has had a history of a lumbar decompression at L4-5 and L5-S1. He had some back but predominant leg pain. He is retired now. He would like to proceed with surgical intervention. He has had his lumbar MRI. 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 There is an asymmetric effect on the L5-S1 nerve root. He has foraminal stenosis noted.  He is here with his wife. He did get relief when he put his foot up on a step stool and leans forward.   No past medical history on file.  No past surgical history on file.  No family history on file. Social History:  has no tobacco, alcohol, and drug history on file.  Allergies: Allergies not on file   (Not in a hospital admission)  No results found for this or any previous visit (from the past 48 hour(s)). No results found.  Review of Systems  Constitutional: Negative.   HENT: Negative.   Eyes: Negative.   Respiratory: Negative.   Cardiovascular: Negative.   Gastrointestinal: Negative.   Genitourinary: Negative.   Musculoskeletal: Positive for back pain.  Skin: Negative.   Neurological: Positive for sensory change and focal  weakness.  Psychiatric/Behavioral: Negative.     There were no vitals taken for this visit. Physical Exam  Constitutional: He is oriented to person, place, and time. He appears well-developed and well-nourished.  HENT:  Head: Normocephalic and atraumatic.  Eyes: Conjunctivae and EOM are normal. Pupils are equal, round, and reactive to light.  Neck: Normal range of motion. Neck supple.  Cardiovascular: Normal rate and regular rhythm.   Respiratory: Effort normal and breath sounds normal.  GI: Soft. Bowel sounds are normal.  Musculoskeletal:  He is in mild distress. He is standing with his foot up on a stool.  Lumbar spine exam reveals no evidence of soft tissue swelling, ecchymosis or deformity. The abdomen is soft and nontender. Nontender over the trochanters. No cellulitis or lymphadenopathy.  Straight leg raise produces buttock, thigh and calf pain. He has EHL weakness. Altered sensation in the L5 dermatome. There is some discomfort in the lumbosacral junction with forward flexion and extension. Patient is normoreflexic. There is no Babinski or clonus. Sensory exam is intact to light touch. The patient has good distal pulses. No DVT. No pain and normal range of motion without instability of the hips, knees and ankles.  Neurological: He is alert and oriented to person, place, and time. He has normal reflexes.  Skin: Skin is warm and dry.  Psychiatric: He has a normal mood and affect.    He has had his lumbar MRI. 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 There is an  asymmetric effect on the L5-S1 nerve root. He has foraminal stenosis noted.  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 History of lumbar decompression at L4-5 and L5-S1 with slight hypertrophy.  We discussed options at this point to include:  1. Living with the symptoms. 2. Consider a foraminotomy, partial full fasciectomy with a lateral  mass fusion versus two and possibly three level fusion.  We discussed that he is not challenged by his back pain, it is predominantly his leg pain. He had an acute injury. This was confirmed by a Nerve Conduction Study as opposed to chronic. He does have foraminal stenosis which would require a partial fasciectomy to full. I described that in detail.  I had an extensive discussion of the risks and benefits of the lumbar decompression with the patient including bleeding, infection, damage to neurovascular structures, epidural fibrosis, CSF leak requiring repair. We also discussed increase in pain, adjacent segment disease, recurrent disc herniation, need for future surgery including repeat decompression and/or fusion. We also discussed risks of postoperative hematoma, paralysis, anesthetic complications including DVT, PE, death, cardiopulmonary dysfunction. In addition, the perioperative and postoperative courses were discussed in detail including the rehabilitative time and return to functional activity and work. I provided the patient with an illustrated handout and utilized the appropriate surgical models.  I indicated with the duration of time over two years he may undergo a decompression without avail or worsening symptoms. Considering the slight improvement that he has had , the is ability to compensate and unload the nerve root may improve his prognosis.  We discussed the possibility of a decompression for a multi-level fusion in the future. We spent considerable time discussing this. We will proceed accordingly. He has otherwise been healthy. Again,there has been considerable time from the initial indication. We may consider a repeat Nerve Conduction Study at some point.  Plan microlumbar decompression L5-S1 right, possible lateral mass fusion  Londell Noll M. Jayleigh Notarianni PA-C for Dr. Tonita Cong 02/04/2014, 8:42 PM

## 2014-02-11 NOTE — Brief Op Note (Signed)
02/11/2014  1:52 PM  PATIENT:  Noah Jensen  63 y.o. 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  PROCEDURE:  Procedure(s): MICRO LUMBAR DECOMPRESSION, MICRODISCECTOMY,  LATERAL MASS FUSION WITH AUTOGRAFT  LUMBAR FIVE TO SACRAL ONE  ON THE RIGHT, REDO MICRODISCECTOMY LUMBAR FOUR TO LUMBAR FIVE (Right)  SURGEON:  Surgeon(s) and Role:    * Johnn Hai, MD - Primary  PHYSICIAN ASSISTANT:   ASSISTANTS: Bissell   ANESTHESIA:   general  EBL:  Total I/O In: 2000 [I.V.:2000] Out: 450 [Urine:200; Blood:250]  BLOOD ADMINISTERED:none  DRAINS: none   LOCAL MEDICATIONS USED:  MARCAINE     SPECIMEN:  Source of Specimen:  L5S1 disc  DISPOSITION OF SPECIMEN:  PATHOLOGY  COUNTS:  YES  TOURNIQUET:  * No tourniquets in log *  DICTATION: .Other Dictation: Dictation Number [id number]  PLAN OF CARE: Admit for overnight observation  PATIENT DISPOSITION:  PACU - hemodynamically stable.   Delay start of Pharmacological VTE agent (>24hrs) due to surgical blood loss or risk of bleeding: yes

## 2014-02-11 NOTE — Interval H&P Note (Signed)
History and Physical Interval Note:  02/11/2014 7:23 AM  Noah Jensen  has presented today for surgery, with the diagnosis of stenosis and HNP L5 - S1 on the right  The various methods of treatment have been discussed with the patient and family. After consideration of risks, benefits and other options for treatment, the patient has consented to  Procedure(s): MICRO LUMBAR DECOMPRESSION L5 - S1 ON THE RIGHT/POSSIBLE LATERAL MASS FUSION L5 - S1 WITH AUTOLOGOUS AUTOGRAFT BONE  1 LEVEL (Right) as a surgical intervention .  The patient's history has been reviewed, patient examined, no change in status, stable for surgery.  I have reviewed the patient's chart and labs.  Questions were answered to the patient's satisfaction.     Johnn Hai

## 2014-02-11 NOTE — Anesthesia Preprocedure Evaluation (Addendum)
Anesthesia Evaluation  Patient identified by MRN, date of birth, ID band Patient awake    Reviewed: Allergy & Precautions, H&P , NPO status , Patient's Chart, lab work & pertinent test results  Airway Mallampati: II TM Distance: >3 FB Neck ROM: full    Dental no notable dental hx. (+) Teeth Intact, Dental Advisory Given   Pulmonary neg pulmonary ROS, former smoker,  breath sounds clear to auscultation  Pulmonary exam normal       Cardiovascular Exercise Tolerance: Good negative cardio ROS  Rhythm:regular Rate:Normal     Neuro/Psych negative neurological ROS  negative psych ROS   GI/Hepatic negative GI ROS, Neg liver ROS, GERD-  Medicated and Controlled,  Endo/Other  diabetes, Well Controlled, Type 2, Oral Hypoglycemic Agents  Renal/GU negative Renal ROS  negative genitourinary   Musculoskeletal   Abdominal   Peds  Hematology negative hematology ROS (+)   Anesthesia Other Findings   Reproductive/Obstetrics negative OB ROS                          Anesthesia Physical Anesthesia Plan  ASA: III  Anesthesia Plan: General   Post-op Pain Management:    Induction: Intravenous  Airway Management Planned: Oral ETT  Additional Equipment:   Intra-op Plan:   Post-operative Plan: Extubation in OR  Informed Consent: I have reviewed the patients History and Physical, chart, labs and discussed the procedure including the risks, benefits and alternatives for the proposed anesthesia with the patient or authorized representative who has indicated his/her understanding and acceptance.   Dental Advisory Given  Plan Discussed with: CRNA and Surgeon  Anesthesia Plan Comments:         Anesthesia Quick Evaluation

## 2014-02-11 NOTE — Anesthesia Postprocedure Evaluation (Signed)
  Anesthesia Post-op Note  Patient: Noah Jensen  Procedure(s) Performed: Procedure(s) (LRB): MICRO LUMBAR DECOMPRESSION, MICRODISCECTOMY,  LATERAL MASS FUSION WITH AUTOGRAFT  LUMBAR FIVE TO SACRAL ONE  ON THE RIGHT, REDO MICRODISCECTOMY LUMBAR FOUR TO LUMBAR FIVE (Right)  Patient Location: PACU  Anesthesia Type: General  Level of Consciousness: awake and alert   Airway and Oxygen Therapy: Patient Spontanous Breathing  Post-op Pain: mild  Post-op Assessment: Post-op Vital signs reviewed, Patient's Cardiovascular Status Stable, Respiratory Function Stable, Patent Airway and No signs of Nausea or vomiting  Last Vitals:  Filed Vitals:   02/11/14 1445  BP: 131/68  Pulse: 81  Temp:   Resp: 12    Post-op Vital Signs: stable   Complications: No apparent anesthesia complications

## 2014-02-11 NOTE — Transfer of Care (Signed)
Immediate Anesthesia Transfer of Care Note  Patient: Noah Jensen  Procedure(s) Performed: Procedure(s): MICRO LUMBAR DECOMPRESSION, MICRODISCECTOMY,  LATERAL MASS FUSION WITH AUTOGRAFT  LUMBAR FIVE TO SACRAL ONE  ON THE RIGHT, REDO MICRODISCECTOMY LUMBAR FOUR TO LUMBAR FIVE (Right)  Patient Location: PACU  Anesthesia Type:General  Level of Consciousness: awake, alert , oriented and patient cooperative  Airway & Oxygen Therapy: Patient Spontanous Breathing and Patient connected to face mask oxygen  Post-op Assessment: Report given to PACU RN and Post -op Vital signs reviewed and stable  Post vital signs: stable  Complications: No apparent anesthesia complications

## 2014-02-11 NOTE — Discharge Instructions (Signed)
Walk As Tolerated utilizing back precautions.  No bending, twisting, or lifting.  No driving for 2 weeks.   °Aquacel dressing may remain in place for 7 days. May shower with aquacel dressing in place. After 7 days, remove aquacel dressing and place gauze and tape dressing which should be kept clean and dry and changed daily. °See Dr. Beane in office in 10 to 14 days. Begin taking aspirin 81mg per day starting 4 days after your surgery if not allergic to aspirin or on another blood thinner. °Walk daily even outside. Use a cane or walker only if necessary. °Avoid sitting on soft sofas. ° °

## 2014-02-12 ENCOUNTER — Encounter (HOSPITAL_COMMUNITY): Payer: Self-pay | Admitting: Specialist

## 2014-02-12 LAB — CBC
HEMATOCRIT: 38.8 % — AB (ref 39.0–52.0)
HEMOGLOBIN: 13.1 g/dL (ref 13.0–17.0)
MCH: 29.7 pg (ref 26.0–34.0)
MCHC: 33.8 g/dL (ref 30.0–36.0)
MCV: 88 fL (ref 78.0–100.0)
Platelets: 181 10*3/uL (ref 150–400)
RBC: 4.41 MIL/uL (ref 4.22–5.81)
RDW: 13.5 % (ref 11.5–15.5)
WBC: 12 10*3/uL — ABNORMAL HIGH (ref 4.0–10.5)

## 2014-02-12 LAB — GLUCOSE, CAPILLARY
GLUCOSE-CAPILLARY: 104 mg/dL — AB (ref 70–99)
Glucose-Capillary: 146 mg/dL — ABNORMAL HIGH (ref 70–99)
Glucose-Capillary: 112 mg/dL — ABNORMAL HIGH (ref 70–99)

## 2014-02-12 LAB — BASIC METABOLIC PANEL
BUN: 12 mg/dL (ref 6–23)
CO2: 24 mEq/L (ref 19–32)
CREATININE: 1.04 mg/dL (ref 0.50–1.35)
Calcium: 9.9 mg/dL (ref 8.4–10.5)
Chloride: 103 mEq/L (ref 96–112)
GFR calc Af Amer: 88 mL/min — ABNORMAL LOW (ref >90)
GFR, EST NON AFRICAN AMERICAN: 76 mL/min — AB (ref >90)
GLUCOSE: 117 mg/dL — AB (ref 70–99)
Potassium: 4.2 mEq/L (ref 3.7–5.3)
Sodium: 140 mEq/L (ref 137–147)

## 2014-02-12 MED ORDER — METHOCARBAMOL 500 MG PO TABS: 500.0000 mg | ORAL_TABLET | Freq: Four times a day (QID) | ORAL | Status: DC | PRN

## 2014-02-12 MED ORDER — OXYCODONE-ACETAMINOPHEN 5-325 MG PO TABS: 1.0000 | ORAL_TABLET | ORAL | Status: DC | PRN

## 2014-02-12 MED ORDER — ASPIRIN EC 81 MG PO TBEC: 81.0000 mg | DELAYED_RELEASE_TABLET | Freq: Every day | ORAL | Status: DC

## 2014-02-12 MED ORDER — DSS 100 MG PO CAPS: 100.0000 mg | ORAL_CAPSULE | Freq: Two times a day (BID) | ORAL | Status: DC

## 2014-02-12 NOTE — Progress Notes (Addendum)
CARE MANAGEMENT NOTE 02/12/2014  Patient:  Noah Jensen, Noah Jensen   Account Number:  [id number]  Date Initiated:  02/12/2014  Documentation initiated by:  Rendy Lazard  Subjective/Objective Assessment:   pt with multiple level microdiskectomy     Action/Plan:   home with hhc if needed   Anticipated DC Date:  02/14/2014   Anticipated DC Plan:  [facility]  In-house referral  NA      DC Planning Services  CM consult      Choice offered to / List presented to:  C-1 Patient   DME arranged  [place]  3-N-1      DME agency  [facility].        Status of service:  In process, will continue to follow Medicare Important Message given?  NA - LOS <3 / Initial given by admissions (If response is "NO", the following Medicare IM given date fields will be blank) Date Medicare IM given:   Date Additional Medicare IM given:    Discharge Disposition:    Per UR Regulation:  Reviewed for med. necessity/level of care/duration of stay  If discussed at [place] of Stay Meetings, dates discussed:    Comments:  [id number]/Ilda Laskin,RN,BSN,CCM: patient doing well after mulitple level disckectomy/will follow for any hhc needs/none recommended by pt or ot/  [id number]/Willow Reczek,RN,BSN,CCM/Number for workers compensation is not current/per rep at number on facesheet they have not covered [employer] since 2006/pt call his lawyer I spoke with resp-was given number to call to Chauncey Mann -at [phone]- out of the office left message for her to call me back asap-left [facility] insurance comission file number [phone] and id number of [id number]/message left htat patient has been discharged and needs approval for the dme asap. have spoken with patient and wife they have elected to go ahead and purchase the rolling walker and 3 in one commode-[facility] [place] with advance hhc notified and will bring equipment and payment information to the patient.

## 2014-02-12 NOTE — Progress Notes (Signed)
CSW consulted for SNF placement. PN reviewed. Pt plans to eturn home following hospital d/c. PT does not recommend follow up at d/c. CSW signing off.  Werner Lean LCSW [phone]

## 2014-02-12 NOTE — Progress Notes (Signed)
CARE MANAGEMENT NOTE 02/12/2014  Patient:  Noah Jensen, Noah Jensen   Account Number:  [id number]  Date Initiated:  02/12/2014  Documentation initiated by:  Gradie Butrick  Subjective/Objective Assessment:   pt with multiple level microdiskectomy     Action/Plan:   home with hhc if needed   Anticipated DC Date:  02/14/2014   Anticipated DC Plan:  [place] referral  NA      DC Planning Services  CM consult      Choice offered to / List presented to:             Status of service:  In process, will continue to follow Medicare Important Message given?  NA - LOS <3 / Initial given by admissions (If response is "NO", the following Medicare IM given date fields will be blank) Date Medicare IM given:   Date Additional Medicare IM given:    Discharge Disposition:    Per UR Regulation:  Reviewed for med. necessity/level of care/duration of stay  If discussed at [place] of Stay Meetings, dates discussed:    Comments:  [id number]/Dominika Losey,RN,BSN,CCM: patient doing well after mulitple level disckectomy/will follow for any hhc needs/none recommended by pt or ot/

## 2014-02-12 NOTE — Progress Notes (Signed)
Subjective: 1 Day Post-Op Procedure(s) (LRB): MICRO LUMBAR DECOMPRESSION, MICRODISCECTOMY,  LATERAL MASS FUSION WITH AUTOGRAFT  LUMBAR FIVE TO SACRAL ONE  ON THE RIGHT, REDO MICRODISCECTOMY LUMBAR FOUR TO LUMBAR FIVE (Right) Patient reports pain as mild. Seen in AM rounds by Dr. Tonita Cong. Leg is feeling better this AM. Foley not yet removed. No other c/o this AM. He is feeling ready to go home.  Objective: Vital signs in last 24 hours: Temp:  [97.5 F (36.4 C)-99 F (37.2 C)] 98.4 F (36.9 C) (05/28 0454) Pulse Rate:  [70-91] 70 (05/28 0454) Resp:  [10-18] 18 (05/28 0454) BP: (116-152)/(60-76) 116/70 mmHg (05/28 0454) SpO2:  [95 %-100 %] 97 % (05/28 0454) Weight:  [102.1 kg (225 lb 1.4 oz)] 102.1 kg (225 lb 1.4 oz) (05/27 1530)  Intake/Output from previous day: 05/27 0701 - 05/28 0700 In: 3170 [P.O.:720; I.V.:2450] Out: 1950 [Urine:1700; Blood:250] Intake/Output this shift:     Recent Labs  02/12/14 0415  HGB 13.1    Recent Labs  02/12/14 0415  WBC 12.0*  RBC 4.41  HCT 38.8*  PLT 181    Recent Labs  02/12/14 0415  NA 140  K 4.2  CL 103  CO2 24  BUN 12  CREATININE 1.04  GLUCOSE 117*  CALCIUM 9.9   No results found for this basename: LABPT, INR,  in the last 72 hours  Neurologically intact ABD soft Neurovascular intact Sensation intact distally Intact pulses distally Dorsiflexion/Plantar flexion intact Incision: dressing C/D/I and no drainage No cellulitis present Compartment soft no calf pain or sign of DVT  Assessment/Plan: 1 Day Post-Op Procedure(s) (LRB): MICRO LUMBAR DECOMPRESSION, MICRODISCECTOMY,  LATERAL MASS FUSION WITH AUTOGRAFT  LUMBAR FIVE TO SACRAL ONE  ON THE RIGHT, REDO MICRODISCECTOMY LUMBAR FOUR TO LUMBAR FIVE (Right) Advance diet Up with therapy D/C IV fluids Dr Tonita Cong discussed D/C instructions, Lspine precautions Remove foley this AM If no void within 4 hrs of foley removal, bladder scan and straight cath prn Plan D/C home later  today as long as voiding without difficulty and pain well controlled in PT Follow up 10-14 days post-op  Yazlyn Wentzel M. Knox Holdman 02/12/2014, 8:00 AM

## 2014-02-12 NOTE — Op Note (Signed)
NAMEMILLION, MAHARAJ                ACCOUNT NO.:  [id number]  MEDICAL RECORD NO.:  [id number]  LOCATION:  3825                         FACILITY:  [facility]  PHYSICIAN:  Susa Day, M.D.    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  PROCEDURES PERFORMED: 1. Redo lumbar decompression at L4-5 and at L5-S1. 2. Hemilaminectomy of L5. 3. Partial medial hemifacetectomy, L5-S1. 4. Foraminotomy of L5. 5. Microdiskectomy at L5-S1, right. 6. Lateral mass fusion, L5-S1 utilizing autologous bone. 7. Foraminotomies of S1 and of L5.  ANESTHESIA:  General.  ASSISTANT:  Cleophas Dunker, PA.  Technical difficulty increased due to the patient's previous surgery, extensive scar tissue and elevated BMI.  The patient was BMI of 35.  BRIEF HISTORY:  A 63 year old with history of lumbar decompression in the past at L4-5 and L5-S1, done well, had disk degeneration, had a work injury, had a disk herniation at L5-S1 to the right into the neural foramen.  He had L5 radiculopathy and mild terminal weakness, dermatomal dysesthesias, EHL weakness 2 years previously with seen after the work injury and recommended a decompression at L5-S1 to the right.  However; due to multiple circumstances, he presents 2 years following the injury for decompression.  He had EMG nerve conduction studies at that time indicating denervation.  There, however, had some positional relief of the symptoms, it was felt that at L5-S1 with foraminal disk herniation, to  perform decompression.  There was facet hypertrophy there as well and disk degeneration, we could perform a partial medial hemifacetectomy to fully decompress the 5 foramen, performed diskectomy using autologous bone on the outside of the transverse process of L5 and S1 for additional stability.  Risks and benefits were discussed including bleeding, infection, damage to neurovascular structures, DVT, PE, anesthetic complications, need for fusion, no changes in symptoms, worsening symptoms given the duration of his neural compression, etc.  TECHNIQUE:  With the patient in supine position after the induction of adequate general anesthesia, 2 g of Kefzol and a half of vancomycin due to the patient's of MRSA positivity, he was placed prone on the [place] frame.  All bony prominences were well padded.  Foley to gravity. Lumbar region was prepped and draped in usual sterile fashion.  Two 18- gauge spinal needles were utilized to localize the L4-5, L5-S1 interspace, confirmed by the x-ray.  We made an incision of previous surgical scar.  We excised the scar as well.  Subcutaneous tissue was dissected.  Electrocautery was utilized to achieve hemostasis. Dorsolumbar fascia was divided in line with skin incision on the right side of the interspinous ligament.  Paraspinous muscle elevated from lamina of L4 and L5.  Scar tissue was encountered.  We skeletonized the facet at L5-S1,  identified and confirmed by x-ray.  He had an increased lumbosacral angle as well with shingling of the L5-S1 facet.  He had very small interlaminar windows noted.  We identified the sacral ala of the lamina of L5 and the L4-5 disk space.  I detached the epidural fibrosis from the cephalad edge of S1 and the remaining interlaminar and the remaining neural arch of L5.  We then performed generous foraminotomy of S1.  We used an osteotome and removed approximately 75% of the inferior process of L5.  This was removed with  osteotome and pituitary.  I then identified the superior articulating process of S1 with the epidural fibrosis in the ligamentum flavum.  We defined the borders of the medial portion of the pedicle of S1 and also at cephalad border and the foramen of S1, the S1 nerve root.  There was significant hypertrophic portion of the facet.  We used the 2-mm Kerrison and decompressed the lateral recess to the medial border of the pedicle with the medial aspect of the pedicle and the cephalad aspect of the pedicle removing the predominance of the superior articulating process of cephalad of the pedicle of S1.  There was hypertrophic ligamentum flavum there noted as well as well as disk herniation.  After removing the remainder of the hemilamina of L5, identified the pedicle on the L5 foramen by x-ray with instruments in the foramen of L5 and S1.  I identified the L5 root just cephalad to the pedicle of L5 and out the neural foramen of L5.  There was extensive epidural fibrosis, ligamentum flavum hypertrophy and disk herniation.  I entered the disk space just cephalad to the pedicle of L5 of its cephalad medial border with Epstein, then removed copious portion of disk material from the disk space, angling medially.  We used a nerve hook to remove fragments laterally.  Also with 2 and 3-mm Kerrison, we performed generous foraminotomy of L5, removed the ligamentum flavum.  I spent considerable meticulous time identifying the plane between the L5 root and the ligamentum flavum, hypertrophy and disk herniation.  We continued the decompression out laterally to the lateral border of the pedicles. Unroofing the foramen, undercutting the facet to the point where we were able to pass a he neuroprobe out the foramen of L5 just under the pedicle of L5 and along the L5 root noting epidural fat and noting fat within the foramen and the probe passing freely.  This was after removing ligamentum flavum, hypertrophy,  disk herniation, and multiple bone spurs.  Following this, we used bipolar cautery, utilized to achieve hemostasis.  We used morselized bone graft and placed it over the remaining outer portion of the facet at L5-S1 on the ala and the TT, and was packed it into place.  Used thrombin-soaked Gelfoam after inspection and revealed no evidence of CSF leakage or active bleeding. Had 1 cm excursion of the L5 of root medial to pedicle as well as the S1 nerve root medial to pedicle without tension.  I felt there was excellent decompression of the L5 root out the foramen.  Copiously irrigated the disk space with antibiotic irrigation.  Inspection revealed no CSF leakage or active bleeding.  Thrombin-soaked Gelfoam was placed in the laminotomy defect.  Intermittently had released the [facility] retractor throughout the case irrigating the paraspinous musculatures.  They were inspected, irrigated, no active bleeding. Dorsolumbar fascia was reapproximated with #1 Vicryl interrupted figure- of-8 sutures, subcu with 2-0 and skin with staples.  Wound was  dressed sterilely.  Placed supine on hospital bed, extubated without difficulty, and transported to the recovery room in satisfactory condition.  The patient tolerated the procedure well.  No complications.  Assistant, Cleophas Dunker, was used for the patient positioning, gentle intermittent neural traction, suction and closure.  Blood loss was 250 mL.     Susa Day, M.D.     Geralynn Rile  D:  02/11/2014  T:  02/12/2014  Job:  [id number]

## 2014-02-12 NOTE — Evaluation (Signed)
Physical Therapy Evaluation Patient Details Name: Noah Jensen MRN: [id number] DOB: 1951/02/02 Today's Date: 02/12/2014   History of Present Illness  Pt is s/p MICRO LUMBAR DECOMPRESSION, MICRODISCECTOMY,  LATERAL MASS FUSION WITH AUTOGRAFT  LUMBAR FIVE TO SACRAL ONE  ON THE RIGHT, REDO MICRODISCECTOMY LUMBAR FOUR TO LUMBAR FIVE (Right)  Clinical Impression  Pt s/p back surgery as above presents with functional mobility limitations 2* post op pain and back precautions.  Pt should progress to d/c home with family assist and no immediate PT follow up required.    Follow Up Recommendations No PT follow up    Equipment Recommendations  Rolling walker with 5" wheels    Recommendations for Other Services OT consult     Precautions / Restrictions Precautions Precautions: Back Precaution Booklet Issued: Yes (comment) Precaution Comments: back care handout issued by PT; reviewed and reinforced precautions during session Restrictions Weight Bearing Restrictions: No      Mobility  Bed Mobility Overal bed mobility: Needs Assistance Bed Mobility: Sit to Supine Rolling: Min guard Sidelying to sit: Min guard   Sit to supine: Min guard   General bed mobility comments: increased time, verbal cues for log roll technique  Transfers Overall transfer level: Needs assistance Equipment used: Rolling walker (2 wheeled) Transfers: Sit to/from Stand Sit to Stand: Min guard         General transfer comment: verbal cues for hand placement and back precautions  Ambulation/Gait Ambulation/Gait assistance: Min assist;Min guard Ambulation Distance (Feet): 200 Feet Assistive device: Rolling walker (2 wheeled) Gait Pattern/deviations: Step-through pattern;Decreased step length - right;Decreased step length - left;Shuffle     General Gait Details: cues for posture and position from [employer]            Wheelchair Mobility    Modified Rankin (Stroke Patients Only)       Balance                                              Pertinent Vitals/Pain 5/10; premed    Home Living Family/patient expects to be discharged to:: Private residence Living Arrangements: Spouse/significant other Available Help at Discharge: Family Type of Home: House Home Access: Stairs to enter Entrance Stairs-Rails: Right;Left;Can reach both Technical brewer of Steps: 2 Home Layout: One level Home Equipment: None      Prior Function Level of Independence: Independent         Comments: used cane and able to walk only limited distance     Hand Dominance        Extremity/Trunk Assessment   Upper Extremity Assessment: Overall WFL for tasks assessed           Lower Extremity Assessment: Overall WFL for tasks assessed         Communication   Communication: No difficulties  Cognition Arousal/Alertness: Awake/alert Behavior During Therapy: WFL for tasks assessed/performed Overall Cognitive Status: Within Functional Limits for tasks assessed                      General Comments      Exercises        Assessment/Plan    PT Assessment Patient needs continued PT services  PT Diagnosis Difficulty walking   PT Problem List Decreased activity tolerance;Decreased mobility;Decreased knowledge of use of DME;Pain;Decreased knowledge of precautions  PT Treatment Interventions DME instruction;Gait training;Stair training;Therapeutic activities;Functional  mobility training;Patient/family education   PT Goals (Current goals can be found in the Care Plan section) Acute Rehab PT Goals Patient Stated Goal: home when ready PT Goal Formulation: With patient Time For Goal Achievement: 02/16/14 Potential to Achieve Goals: Good    Frequency 7X/week   Barriers to discharge        Co-evaluation               End of Session   Activity Tolerance: Patient tolerated treatment well Patient left: in chair;with call bell/phone within  reach;with family/visitor present Nurse Communication: Mobility status    Functional Assessment Tool Used: clinical judgement Functional Limitation: Mobility: Walking and moving around Mobility: Walking and Moving Around Current Status (J2426): At least 1 percent but less than 20 percent impaired, limited or restricted Mobility: Walking and Moving Around Goal Status [phone]): At least 1 percent but less than 20 percent impaired, limited or restricted    Time: 0905-0930 PT Time Calculation (min): 25 min   Charges:   PT Evaluation $Initial PT Evaluation Tier I: 1 Procedure PT Treatments $Gait Training: 8-22 mins   PT G Codes:   Functional Assessment Tool Used: clinical judgement Functional Limitation: Mobility: Walking and moving around    [employer] 02/12/2014, 11:49 AM

## 2014-02-12 NOTE — Evaluation (Signed)
Occupational Therapy Evaluation Patient Details Name: Noah Jensen MRN: [id number] DOB: Nov 10, 1950 Today's Date: 02/12/2014    History of Present Illness Pt is s/p MICRO LUMBAR DECOMPRESSION, MICRODISCECTOMY,  LATERAL MASS FUSION WITH AUTOGRAFT  LUMBAR FIVE TO SACRAL ONE  ON THE RIGHT, REDO MICRODISCECTOMY LUMBAR FOUR TO LUMBAR FIVE (Right)   Clinical Impression   Pt up with OT for toileting, tub transfers and AE training. Pt would benefit from wide sock aid and wife plans to purchase AE kit so OT will plan to return with wide sock aid to switch out from kit and practice. Pt doing well and wife can assist at home.    Follow Up Recommendations  No OT follow up;Supervision/Assistance - 24 hour    Equipment Recommendations  3 in 1 bedside comode    Recommendations for Other Services       Precautions / Restrictions Precautions Precautions: Back Precaution Booklet Issued: Yes (comment) Precaution Comments: Back care handout with precautions issued by PT. Pt able to state 2/3 on his own. Reviewed all precautions with pt and wife again.      Mobility Bed Mobility                  Transfers Overall transfer level: Needs assistance Equipment used: Rolling walker (2 wheeled) Transfers: Sit to/from Stand Sit to Stand: Min guard         General transfer comment: verbal cues for hand placement and back precautions    Balance                                            ADL Overall ADL's : Needs assistance/impaired Eating/Feeding: Independent;Sitting   Grooming: Wash/dry hands;Set up;Sitting   Upper Body Bathing: Set up;Supervision/ safety;Sitting   Lower Body Bathing: Sit to/from stand;Moderate assistance Lower Body Bathing Details (indicate cue type and reason): without AE Upper Body Dressing : Supervision/safety;Set up;Sitting   Lower Body Dressing: Moderate assistance;Sit to/from stand Lower Body Dressing Details (indicate cue type and  reason): without AE ; see below Toilet Transfer: Min guard;Ambulation;BSC;RW   Toileting- Water quality scientist and Hygiene: Min guard;Sit to/from stand   Tub/ Shower Transfer: Tub transfer;Minimal assistance     General ADL Comments: Educated pt and wife on all back precautions and reviewed back care handout. Pt interested in AE for LB self care and practiced with reacher to doff sock with supervision and don sock with min assist due to regular sock aid not wide enough. Will plan to exchange out regular sock aid with wide version as pt is interested in purchasing AE kit. Pt verbalized understanding of long handled sponge and shoe horn. Practiced tub transfer technique and pt with just a little difficulty stepping over into simulated tub far enough to allow enough room for other foot to step in also. Pt states he will sponge bathe a few days and allow pain to improve and increase strength before he showers in tub. Feel this would be a good plan to wait a few days to shower and discussed having wife with pt for inital showers also.      Vision                     Perception     Praxis      Pertinent Vitals/Pain 5-6/10 back; informed nursing, reposition, rest     Hand Dominance  Extremity/Trunk Assessment Upper Extremity Assessment Upper Extremity Assessment: Overall WFL for tasks assessed           Communication Communication Communication: No difficulties   Cognition Arousal/Alertness: Awake/alert Behavior During Therapy: WFL for tasks assessed/performed Overall Cognitive Status: Within Functional Limits for tasks assessed                     General Comments       Exercises       Shoulder Instructions      Home Living Family/patient expects to be discharged to:: Private residence Living Arrangements: Spouse/significant other Available Help at Discharge: Family               Bathroom Shower/Tub: Teacher, early years/pre:  Standard     Home Equipment: None          Prior Functioning/Environment Level of Independence: Independent             OT Diagnosis: Generalized weakness   OT Problem List: Decreased strength;Decreased knowledge of use of DME or AE   OT Treatment/Interventions: Self-care/ADL training;Patient/family education;Therapeutic activities;DME and/or AE instruction    OT Goals(Current goals can be found in the care plan section) Acute Rehab OT Goals Patient Stated Goal: home when ready OT Goal Formulation: With patient/family Time For Goal Achievement: 02/19/14 Potential to Achieve Goals: Good  OT Frequency: Min 2X/week   Barriers to D/C:            Co-evaluation              End of Session Equipment Utilized During Treatment: Rolling walker  Activity Tolerance: Patient tolerated treatment well Patient left: in chair;with call bell/phone within reach;with family/visitor present   Time: 1610-9604 OT Time Calculation (min): 27 min Charges:  OT General Charges $OT Visit: 1 Procedure OT Evaluation $Initial OT Evaluation Tier I: 1 Procedure OT Treatments $Self Care/Home Management : 8-22 mins $Therapeutic Activity: 8-22 mins G-Codes: OT G-codes **NOT FOR INPATIENT CLASS** Functional Assessment Tool Used: clinical judgement Functional Limitation: Self care Self Care Current Status (V4098): At least 20 percent but less than 40 percent impaired, limited or restricted Self Care Goal Status (J1914): At least 1 percent but less than 20 percent impaired, limited or restricted  Noah Jensen [phone] 02/12/2014, 11:08 AM

## 2014-02-12 NOTE — Discharge Summary (Signed)
Physician Discharge Summary   Patient ID: Noah Jensen MRN: [id number] DOB/AGE: 62-Oct-1952 62 y.o.  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 Dry cough   . Prediabetes   . GERD (gastroesophageal reflux disease)   . Arthritis   . Cancer 2012    prostate/ no surgery or radiation  . Diabetes mellitus without complication    Discharge Diagnoses:   Principal Problem:   Spinal stenosis of lumbar region Active Problems:   Spinal stenosis of lumbar region with radiculopathy  Procedure:  Procedure(s) (LRB): MICRO LUMBAR DECOMPRESSION, MICRODISCECTOMY,  LATERAL MASS FUSION WITH AUTOGRAFT  LUMBAR FIVE TO SACRAL ONE  ON THE RIGHT, REDO MICRODISCECTOMY LUMBAR FOUR TO LUMBAR FIVE (Right)   Consults: None  HPI:  see H&P    Laboratory Data: Hospital Outpatient Visit on 02/06/2014  Component Date Value Ref Range Status  . Sodium 02/06/2014 142  137 - 147 mEq/L Final  . Potassium 02/06/2014 4.1  3.7 - 5.3 mEq/L Final  . Chloride 02/06/2014 100  96 - 112 mEq/L Final  . CO2 02/06/2014 28  19 - 32 mEq/L Final  . Glucose, Bld 02/06/2014 120* 70 - 99 mg/dL Final  . BUN 02/06/2014 16  6 - 23 mg/dL Final  . Creatinine, Ser 02/06/2014 1.10  0.50 - 1.35 mg/dL Final  . Calcium 02/06/2014 10.5  8.4 - 10.5 mg/dL Final  . GFR calc non Af Amer 02/06/2014 71* >90 mL/min Final  . GFR calc Af Amer 02/06/2014 82* >90 mL/min Final   Comment: (NOTE)                          The eGFR has been calculated using the CKD EPI equation.                          This calculation has not been validated in all clinical situations.                          eGFR's persistently <90 mL/min signify possible Chronic Kidney                          Disease.  . WBC 02/06/2014 5.4  4.0 - 10.5 K/uL Final  . RBC 02/06/2014 5.18  4.22 - 5.81 MIL/uL Final  .  Hemoglobin 02/06/2014 15.6  13.0 - 17.0 g/dL Final  . HCT 02/06/2014 46.1  39.0 - 52.0 % Final  . MCV 02/06/2014 89.0  78.0 - 100.0 fL Final  . MCH 02/06/2014 30.1  26.0 - 34.0 pg Final  . MCHC 02/06/2014 33.8  30.0 - 36.0 g/dL Final  . RDW 02/06/2014 13.4  11.5 - 15.5 % Final  . Platelets 02/06/2014 199  150 - 400 K/uL Final  . MRSA, PCR 02/06/2014 POSITIVE* NEGATIVE Final  . Staphylococcus aureus 02/06/2014 POSITIVE* NEGATIVE Final   Comment:                                 The Xpert SA Assay (FDA                          approved for  NASAL specimens                          in patients over 13 years of age),                          is one component of                          a comprehensive surveillance                          program.  Test performance has                          been validated by [employer] for patients greater                          than or equal to 59 year old.                          It is not intended                          to diagnose infection nor to                          guide or monitor treatment.                          RESULT CALLED TO, READ BACK BY AND VERIFIED WITH:                          AFTER HOURS $RemoveBef'@1450'[email or web address]$  ON 02/06/14 BY MCCOY,N.    Recent Labs  02/12/14 0415  HGB 13.1    Recent Labs  02/12/14 0415  WBC 12.0*  RBC 4.41  HCT 38.8*  PLT 181    Recent Labs  02/12/14 0415  NA 140  K 4.2  CL 103  CO2 24  BUN 12  CREATININE 1.04  GLUCOSE 117*  CALCIUM 9.9   No results found for this basename: LABPT, INR,  in the last 72 hours  X-Rays:Dg Chest 2 View  02/06/2014   CLINICAL DATA:  Smoking history.  EXAM: CHEST  2 VIEW  COMPARISON:  None.  FINDINGS: Mediastinum and hilar structures normal. Subsegmental atelectasis and/or scarring left lung base. No focal infiltrate. No pleural effusion or pneumothorax.  IMPRESSION: Subsegmental atelectasis versus pleural parenchymal scarring left lung base.    Electronically Signed   By: Marcello Moores  Register   On: 02/06/2014 10:33   Dg Lumbar Spine 2-3 Views  02/06/2014   CLINICAL DATA:  Preoperative lumbar decompressions; spinal stenosis  EXAM: LUMBAR SPINE - 2-3 VIEW  COMPARISON:  None.  FINDINGS: Standing frontal and lateral views were obtained. There are 5 non-rib-bearing lumbar type vertebral bodies. There is no fracture or spondylolisthesis. There is moderately severe narrowing at L4-5 and L5-S1. There is mild disc space narrowing at L2-3. There are anterior osteophytes at all levels. No erosive change.  IMPRESSION: No  fracture or spondylolisthesis. Osteoarthritic change at multiple levels, most marked at L4-5 and L5-S1.   Electronically Signed   By: Lowella Grip M.D.   On: 02/06/2014 10:32   Dg Spine Portable 1 View  02/11/2014   CLINICAL DATA:  Lumbar decompression at L5-S1  EXAM: PORTABLE SPINE - 1 VIEW  COMPARISON:  Film from earlier in the same day  FINDINGS: Surgical instruments are again noted at the L5-S1 level. The lumbar vertebrae or numbered according to previous nomenclature.   Electronically Signed   By: Inez Catalina M.D.   On: 02/11/2014 12:31   Dg Spine Portable 1 View  02/11/2014   CLINICAL DATA:  Lumbar laminectomy.  EXAM: PORTABLE SPINE - 1 VIEW  COMPARISON:  02/06/2014  FINDINGS: Single cross-table lateral view labeled 3 demonstrates posterior surgical instruments directed at the L5-S1 level.  IMPRESSION: Intraoperative localization at L5-S1.   Electronically Signed   By: Rolm Baptise M.D.   On: 02/11/2014 11:32   Dg Spine Portable 1 View  02/11/2014   CLINICAL DATA:  L5-S1 lumbar laminectomy  EXAM: PORTABLE SPINE - 1 VIEW  COMPARISON:  Film from earlier in the same day  FINDINGS: There are now surgical instruments identified posterior to the L5-S1 interspace. The spinous processes were labeled as per the request of the referring clinician.   Electronically Signed   By: Inez Catalina M.D.   On: 02/11/2014 11:14   Dg Spine Portable 1  View  02/11/2014   CLINICAL DATA:  Lumbar laminectomy  EXAM: PORTABLE SPINE - 1 VIEW  COMPARISON:  Feb 07, 2004  FINDINGS: Lateral lumbar spine was obtained. There is a metallic probe with the tip posterior to the L4 spinous process at the L4-5 interspace level. A second probe has its tip at the level of S2 posteriorly. No fracture or spondylolisthesis. There is moderate disc space narrowing at L4-5 and L5-S1.  IMPRESSION: Probes with tips as described. Osteoarthritic change, primarily at L4-5 and L5-S1. No fracture or spondylolisthesis.   Electronically Signed   By: Lowella Grip M.D.   On: 02/11/2014 11:09    EKG: Orders placed during the hospital encounter of 02/06/14  . EKG 12-LEAD  . EKG 12-LEAD     Hospital Course: Patient was admitted to [facility] and taken to the OR and underwent the above state procedure without complications.  Patient tolerated the procedure well and was later transferred to the recovery room and then to the orthopaedic floor for postoperative care.  They were given PO and IV analgesics for pain control following their surgery.  They were given 24 hours of postoperative antibiotics.   PT was consulted postop to assist with mobility and transfers.  The patient was allowed to be WBAT with therapy and was taught back precautions. Discharge planning was consulted to help with postop disposition and equipment needs.  Patient had a good night on the evening of surgery and started to get up OOB with therapy on day one. Patient was seen in rounds and was ready to go home on day one.  They were given discharge instructions and dressing directions.  They were instructed on when to follow up in the office with Dr. Tonita Cong.   Diet: Diabetic diet Activity:WBAT; Lspine precautions Follow-up:in 10-14 days Disposition - Home Discharged Condition: good   Discharge Instructions   Call MD / Call 911    Complete by:  As directed   If you experience chest pain or shortness of  breath, CALL 911  and be transported to the hospital emergency room.  If you develope a fever above 101 F, pus (white drainage) or increased drainage or redness at the wound, or calf pain, call your surgeon's office.     Constipation Prevention    Complete by:  As directed   Drink plenty of fluids.  Prune juice may be helpful.  You may use a stool softener, such as Colace (over the counter) 100 mg twice a day.  Use MiraLax (over the counter) for constipation as needed.     Diet - low sodium heart healthy    Complete by:  As directed      Increase activity slowly as tolerated    Complete by:  As directed             Medication List    STOP taking these medications       esomeprazole 40 MG capsule  Commonly known as:  [place] these medications       aspirin EC 81 MG tablet  Take 1 tablet (81 mg total) by mouth daily. Resume 4 days post-op     atorvastatin 40 MG tablet  Commonly known as:  LIPITOR  Take 40 mg by mouth daily.     cholecalciferol 1000 UNITS tablet  Commonly known as:  VITAMIN D  Take 1,000 Units by mouth daily.     DSS 100 MG Caps  Take 100 mg by mouth 2 (two) times daily.     finasteride 5 MG tablet  Commonly known as:  PROSCAR  Take 5 mg by mouth daily.     ibuprofen 200 MG tablet  Commonly known as:  ADVIL,MOTRIN  Take 200 mg by mouth every 6 (six) hours as needed for mild pain.     metFORMIN 500 MG (MOD) 24 hr tablet  Commonly known as:  GLUMETZA  Take 500 mg by mouth daily. 10:00 AM     methocarbamol 500 MG tablet  Commonly known as:  ROBAXIN  Take 1 tablet (500 mg total) by mouth every 6 (six) hours as needed for muscle spasms.     Omega-3 Fish Oil 500 MG Caps  Take 1 capsule by mouth daily.     oxyCODONE-acetaminophen 5-325 MG per tablet  Commonly known as:  PERCOCET/ROXICET  Take 1-2 tablets by mouth every 4 (four) hours as needed for moderate pain or severe pain.     tadalafil 5 MG tablet  Commonly known as:  CIALIS  Take 5 mg  by mouth daily as needed for erectile dysfunction.     vitamin B-12 1000 MCG tablet  Commonly known as:  CYANOCOBALAMIN  Take 1,000 mcg by mouth daily.     vitamin E 1000 UNIT capsule  Take 1,000 Units by mouth daily.           Follow-up Information   Follow up with BEANE,JEFFREY C, MD In 2 weeks.   Specialty:  Orthopedic Surgery   Contact information:   [address] [place] [address] [phone]       Signed: Lacie Draft, PA-C Orthopaedic Surgery 02/12/2014, 6:22 PM

## 2014-02-12 NOTE — Progress Notes (Signed)
Occupational Therapy Treatment Patient Details Name: Noah Jensen MRN: [id number] DOB: [birth year] Today's Date: 02/12/2014    History of present illness Pt is s/p MICRO LUMBAR DECOMPRESSION, MICRODISCECTOMY,  LATERAL MASS FUSION WITH AUTOGRAFT  LUMBAR FIVE TO SACRAL ONE  ON THE RIGHT, REDO MICRODISCECTOMY LUMBAR FOUR TO LUMBAR FIVE (Right)   OT comments  Pt practiced with wide sock aid and this worked better for pt. Wife present. Worked on functional transfers and also reinforced back precautions with pt and wife.   Follow Up Recommendations  No OT follow up;Supervision/Assistance - 24 hour    Equipment Recommendations  3 in 1 bedside comode    Recommendations for Other Services      Precautions / Restrictions Precautions Precautions: Back Precaution Booklet Issued: Yes (comment) Precaution Comments: back care handout issued by PT; reviewed and reinforced precautions during session       Mobility Bed Mobility Overal bed mobility: Needs Assistance Bed Mobility: Sit to Supine       Sit to supine: Min guard   General bed mobility comments: increased time, verbal cues for log roll technique  Transfers Overall transfer level: Needs assistance Equipment used: Rolling walker (2 wheeled) Transfers: Sit to/from Stand Sit to Stand: Min guard         General transfer comment: verbal cues for hand placement and back precautions    Balance                                   ADL Overall ADL's : Needs assistance/impaired             Lower Body Dressing: Supervision/safety;Sitting/lateral leans;Cueing for back precautions;With adaptive equipment Lower Body Dressing Details (indicate cue type and reason): wider sock aid worked better for pt. Able to pull sock entiredly on foot whereas with regular sock aid, the sock didnt pull up over heel completely.  Toilet Transfer: Engineer, technical sales Details (indicate cue type and reason): to  return to bed to rest. verbal cues for back precautions. Toileting- Architect and Hygiene: Min guard;Sit to/from stand        General ADL Comments: Wife purchased AE kit after OT eval earlier. Exchanged out regular sock aid for wider version which worked better for pt. Pt requesting to return to bed so assisted back to bed. Verbal cues for how to position self toward [facility] before sitting down. Verbal cues for hand placement and technique with stand to sit.       Vision                     Perception     Praxis      Cognition   Behavior During Therapy: WFL for tasks assessed/performed Overall Cognitive Status: Within Functional Limits for tasks assessed                       Extremity/Trunk Assessment  Upper Extremity Assessment Upper Extremity Assessment: Overall WFL for tasks assessed            Exercises     Shoulder Instructions       General Comments      Pertinent Vitals/ Pain      Home Living Family/patient expects to be discharged to:: Private residence Living Arrangements: Spouse/significant other Available Help at Discharge: Family               Bathroom Shower/Tub: Tub/shower  unit   Bathroom Toilet: Standard     Home Equipment: None          Prior Functioning/Environment Level of Independence: Independent            Frequency Min 2X/week     Progress Toward Goals  OT Goals(current goals can now be found in the care plan section)  Progress towards OT goals: Progressing toward goals  Acute Rehab OT Goals Patient Stated Goal: home when ready OT Goal Formulation: With patient/family Time For Goal Achievement: 02/19/14 Potential to Achieve Goals: Good ADL Goals Pt Will Perform Lower Body Dressing: with supervision;sit to/from stand;with adaptive equipment Pt Will Transfer to Toilet: with supervision;ambulating;bedside commode Pt Will Perform Toileting - Clothing Manipulation and hygiene: with  supervision;sit to/from stand  Plan Discharge plan remains appropriate    Co-evaluation                 End of Session Equipment Utilized During Treatment: Rolling walker   Activity Tolerance Patient tolerated treatment well   Patient Left in bed;with call bell/phone within reach;with family/visitor present   Nurse Communication      Functional Assessment Tool Used: clinical judgement Functional Limitation: Self care Self Care Current Status [id number]): At least 20 percent but less than 40 percent impaired, limited or restricted Self Care Goal Status (S2831): At least 1 percent but less than 20 percent impaired, limited or restricted   Time: 1038-1050 OT Time Calculation (min): 12 min  Charges:    OT General Charges $OT Visit: 1 Procedure OT Treatments $Self Care/Home Management : 8-22 mins   [place] [phone] 02/12/2014, 11:16 AM

## 2014-02-12 NOTE — Progress Notes (Signed)
Dr Tonita Cong PA notified pts concern that right arm swollen from shoulder area to wrist area. Denies any pain,numbness,tingling. 2 + radial,brachial pulses.CMS + Daylene Posey

## 2014-02-12 NOTE — Progress Notes (Signed)
Physical Therapy Treatment Patient Details Name: Noah Jensen MRN: [id number] DOB: Apr 19, 1951 Today's Date: 2014-02-21    History of Present Illness Pt is s/p MICRO LUMBAR DECOMPRESSION, MICRODISCECTOMY,  LATERAL MASS FUSION WITH AUTOGRAFT  LUMBAR FIVE TO SACRAL ONE  ON THE RIGHT, REDO MICRODISCECTOMY LUMBAR FOUR TO LUMBAR FIVE (Right)    PT Comments    Pt progressing well and ready for d/c home from PT perspective.  Follow Up Recommendations  No PT follow up     Equipment Recommendations  Rolling walker with 5" wheels    Recommendations for Other Services OT consult     Precautions / Restrictions Precautions Precautions: Back Precaution Booklet Issued: Yes (comment) Precaution Comments: back care handout issued by PT; reviewed and reinforced precautions during session Restrictions Weight Bearing Restrictions: No    Mobility  Bed Mobility Overal bed mobility: Needs Assistance Bed Mobility: Sidelying to Sit;Sit to Sidelying Rolling: Min guard Sidelying to sit: Min guard Supine to sit: Min guard Sit to supine: Min guard Sit to sidelying: Min guard General bed mobility comments: increased time, verbal cues for log roll technique  Transfers Overall transfer level: Needs assistance Equipment used: Rolling walker (2 wheeled) Transfers: Sit to/from Stand Sit to Stand: Supervision         General transfer comment: verbal cues for hand placement and back precautions  Ambulation/Gait Ambulation/Gait assistance: Min guard;Supervision Ambulation Distance (Feet): 250 Feet Assistive device: Rolling walker (2 wheeled) Gait Pattern/deviations: Step-through pattern;Decreased step length - right;Decreased step length - left;Shuffle     General Gait Details: cues for posture and position from RW   Stairs Stairs: Yes Stairs assistance: Min guard Stair Management: Two rails;Step to pattern;Forwards Number of Stairs: 2 General stair comments: cues for  sequence  Wheelchair Mobility    Modified Rankin (Stroke Patients Only)       Balance                                    Cognition Arousal/Alertness: Awake/alert Behavior During Therapy: WFL for tasks assessed/performed Overall Cognitive Status: Within Functional Limits for tasks assessed                      Exercises      General Comments        Pertinent Vitals/Pain 4/10; premed    Home Living                      Prior Function            PT Goals (current goals can now be found in the care plan section) Acute Rehab PT Goals Patient Stated Goal: home when ready PT Goal Formulation: With patient Time For Goal Achievement: 02/16/14 Potential to Achieve Goals: Good Progress towards PT goals: Progressing toward goals    Frequency  7X/week    PT Plan Current plan remains appropriate    Co-evaluation             End of Session   Activity Tolerance: Patient tolerated treatment well Patient left: in bed;with call bell/phone within reach;with family/visitor present     Time: 1093-2355 PT Time Calculation (min): 20 min  Charges:  $Gait Training: 8-22 mins                    G Codes:      Mathis Fare 2014-02-21, 4:58 PM

## 2014-10-18 IMAGING — CR DG CHEST 2V
2 series · 2 of 2 positions shown · non-contrast
Comparison: None.

CLINICAL DATA: Smoking history.

EXAM:
CHEST  2 VIEW

[w chest pa]
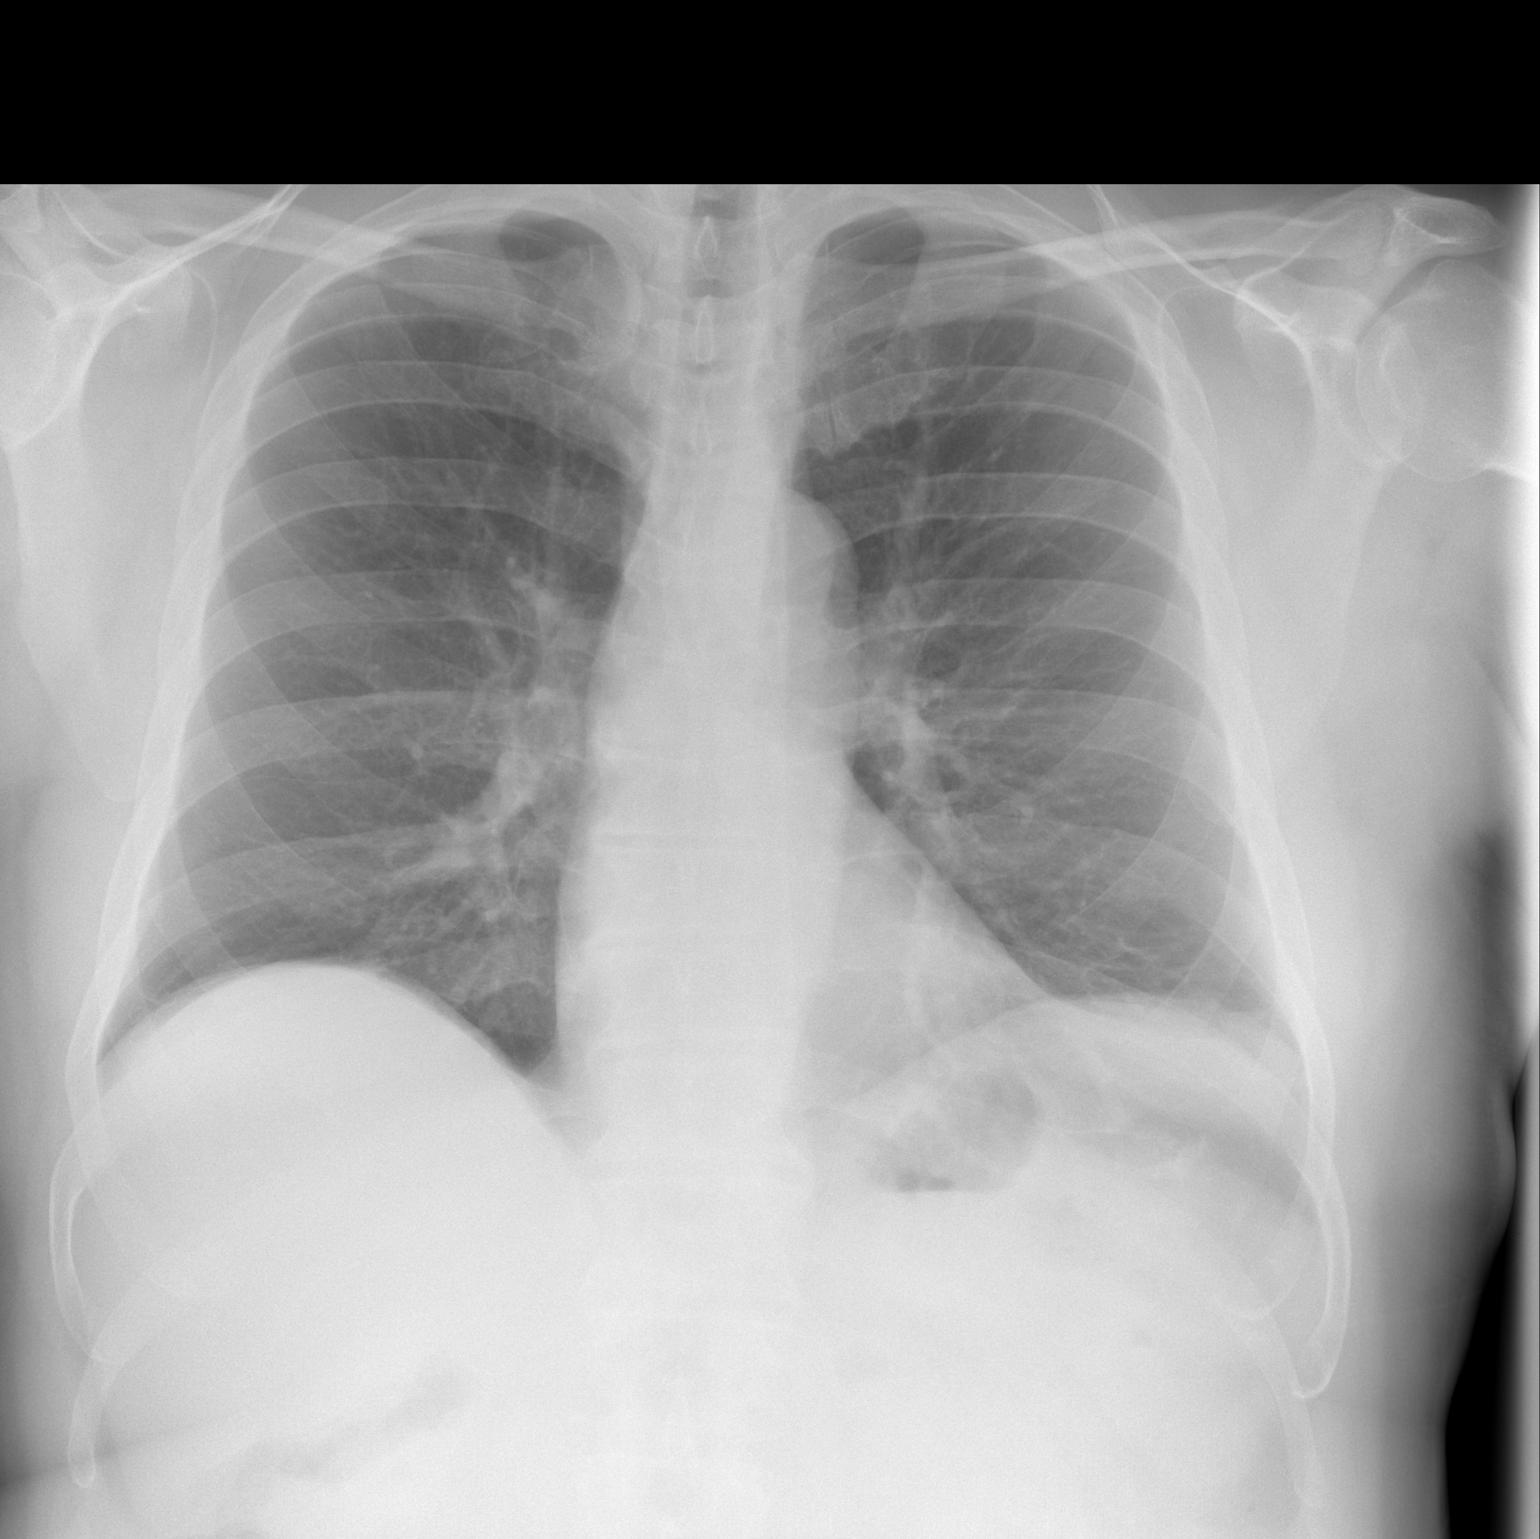

[w chest lat]
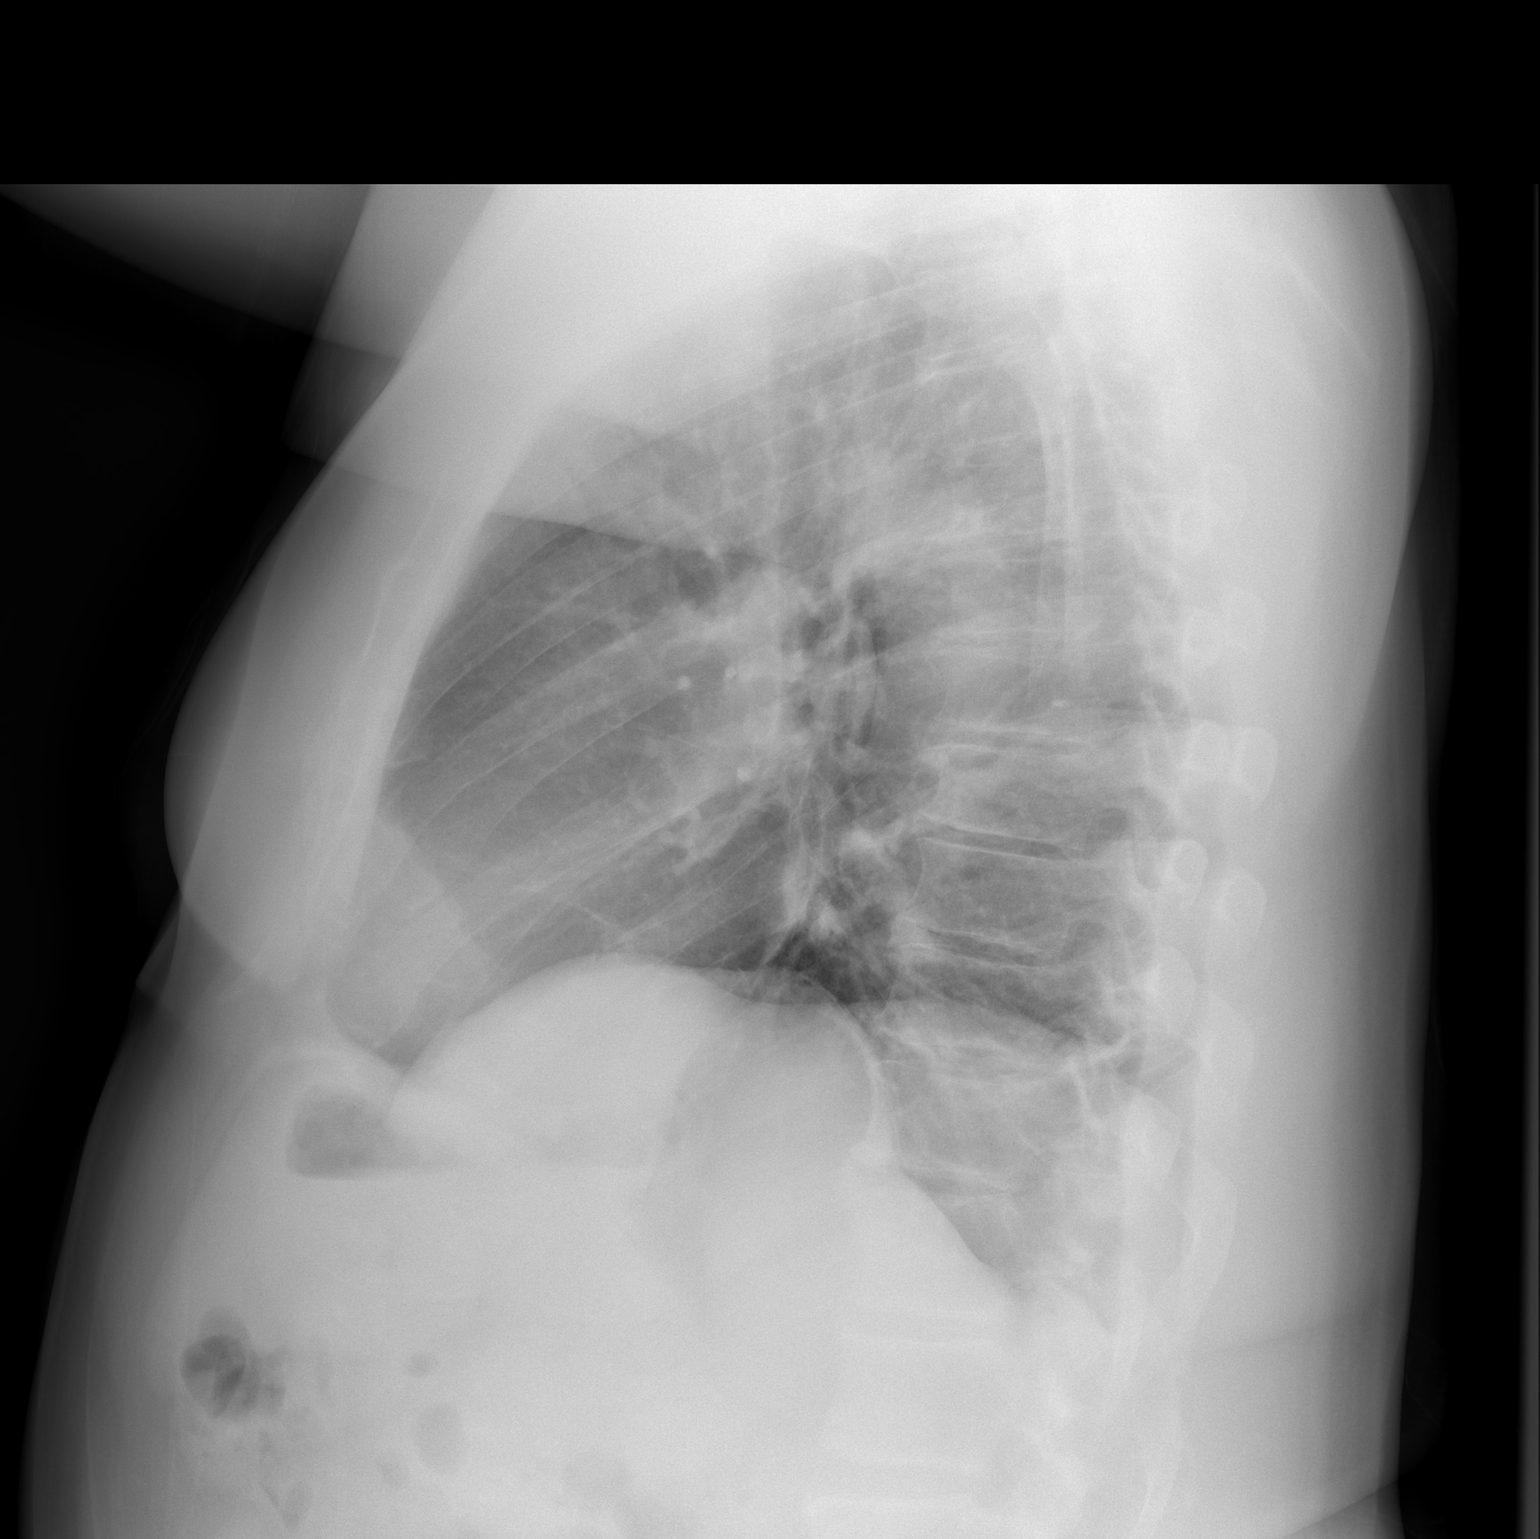

[2 of 2 positions shown; findings below may reference images not displayed]

FINDINGS: Mediastinum and hilar structures normal. Subsegmental atelectasis
and/or scarring left lung base. No focal infiltrate. No pleural
effusion or pneumothorax.
IMPRESSION: Subsegmental atelectasis versus pleural parenchymal scarring left
lung base.

## 2014-10-18 IMAGING — CR DG LUMBAR SPINE 2-3V
2 series · 2 of 2 positions shown · non-contrast
Comparison: None.

CLINICAL DATA: Preoperative lumbar decompressions; spinal stenosis

EXAM:
LUMBAR SPINE - 2-3 VIEW

[w l-spine a.p. *]
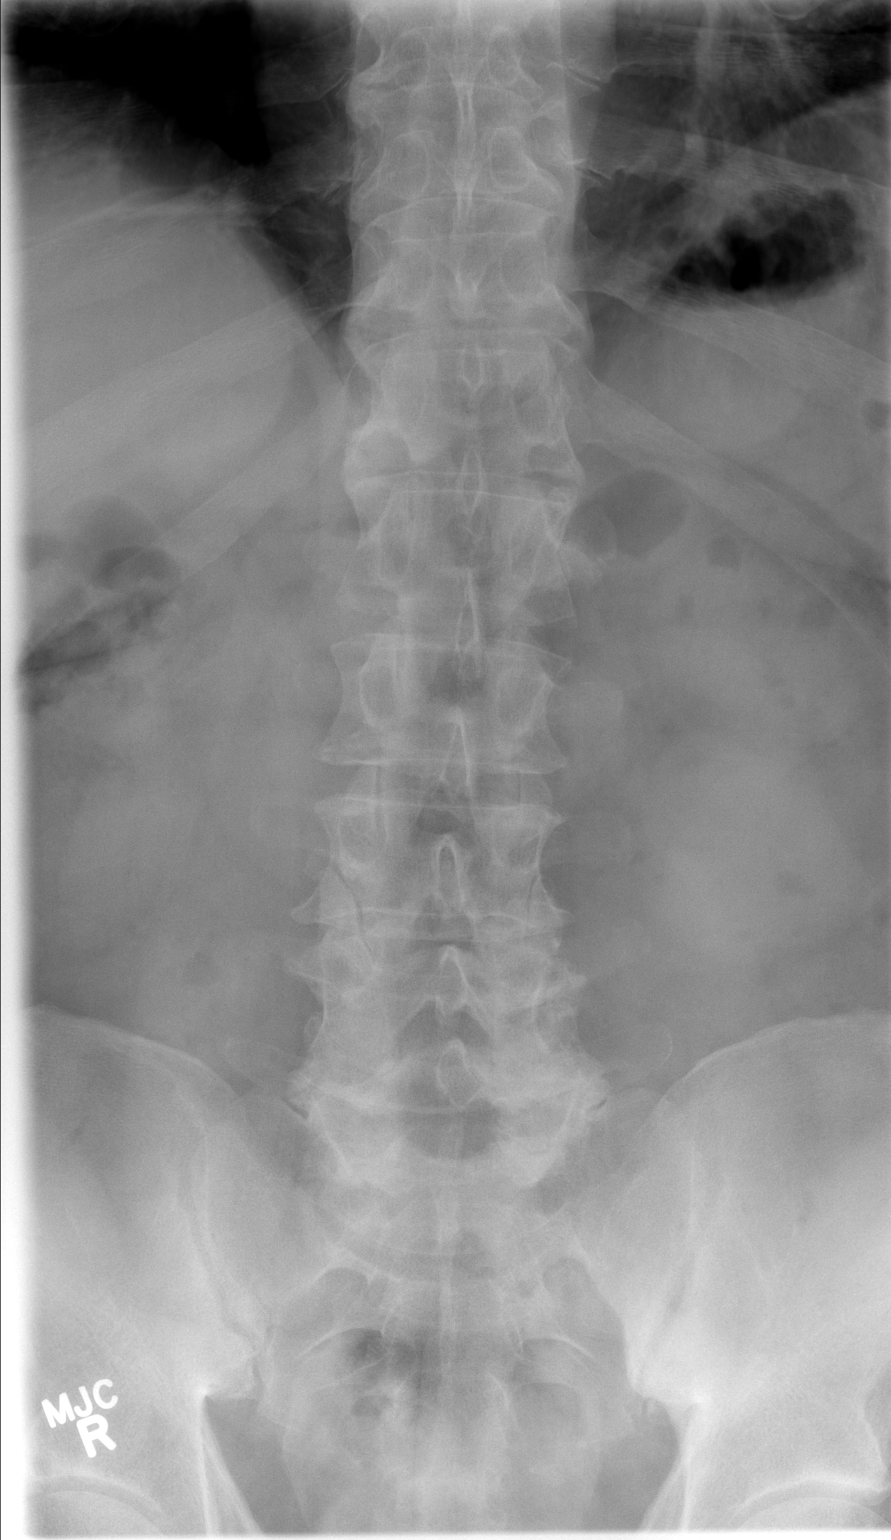

[w l-spine lat]
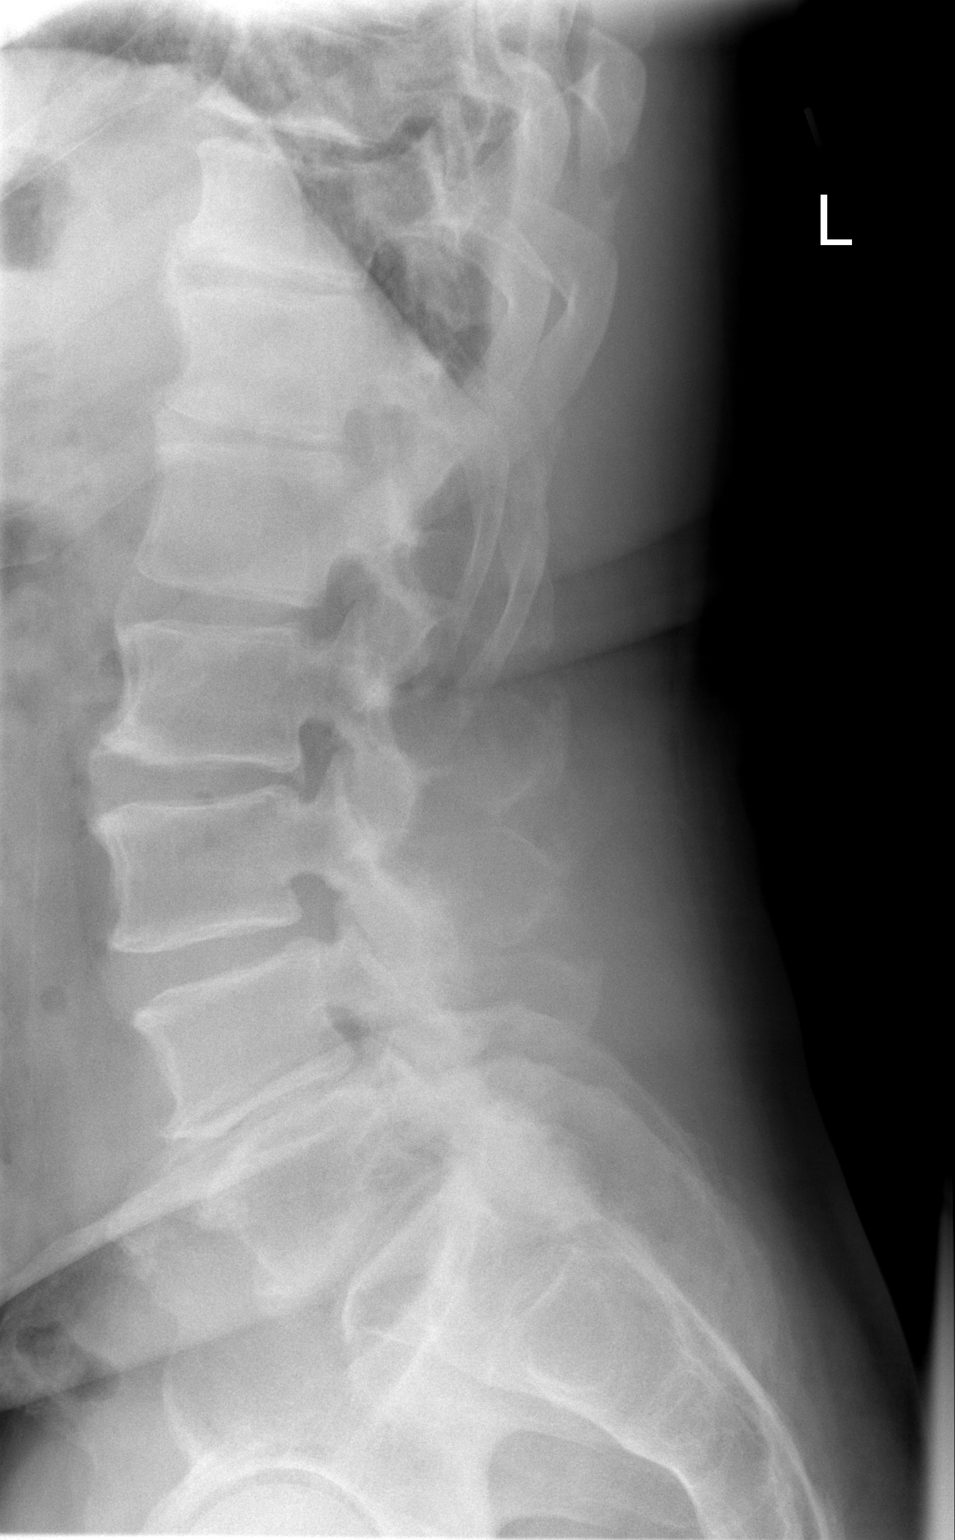

[2 of 2 positions shown; findings below may reference images not displayed]

FINDINGS: Standing frontal and lateral views were obtained. There are 5
non-rib-bearing lumbar type vertebral bodies. There is no fracture
or spondylolisthesis. There is moderately severe narrowing at L4-5
and L5-S1. There is mild disc space narrowing at L2-3. There are
anterior osteophytes at all levels. No erosive change.
IMPRESSION: No fracture or spondylolisthesis. Osteoarthritic change at multiple
levels, most marked at L4-5 and L5-S1.

## 2014-10-23 IMAGING — CR DG SPINE 1V PORT
1 series · 1 of 1 positions shown · non-contrast
Comparison: February 07, 2004

CLINICAL DATA: Lumbar laminectomy

EXAM:
PORTABLE SPINE - 1 VIEW

[lateral]
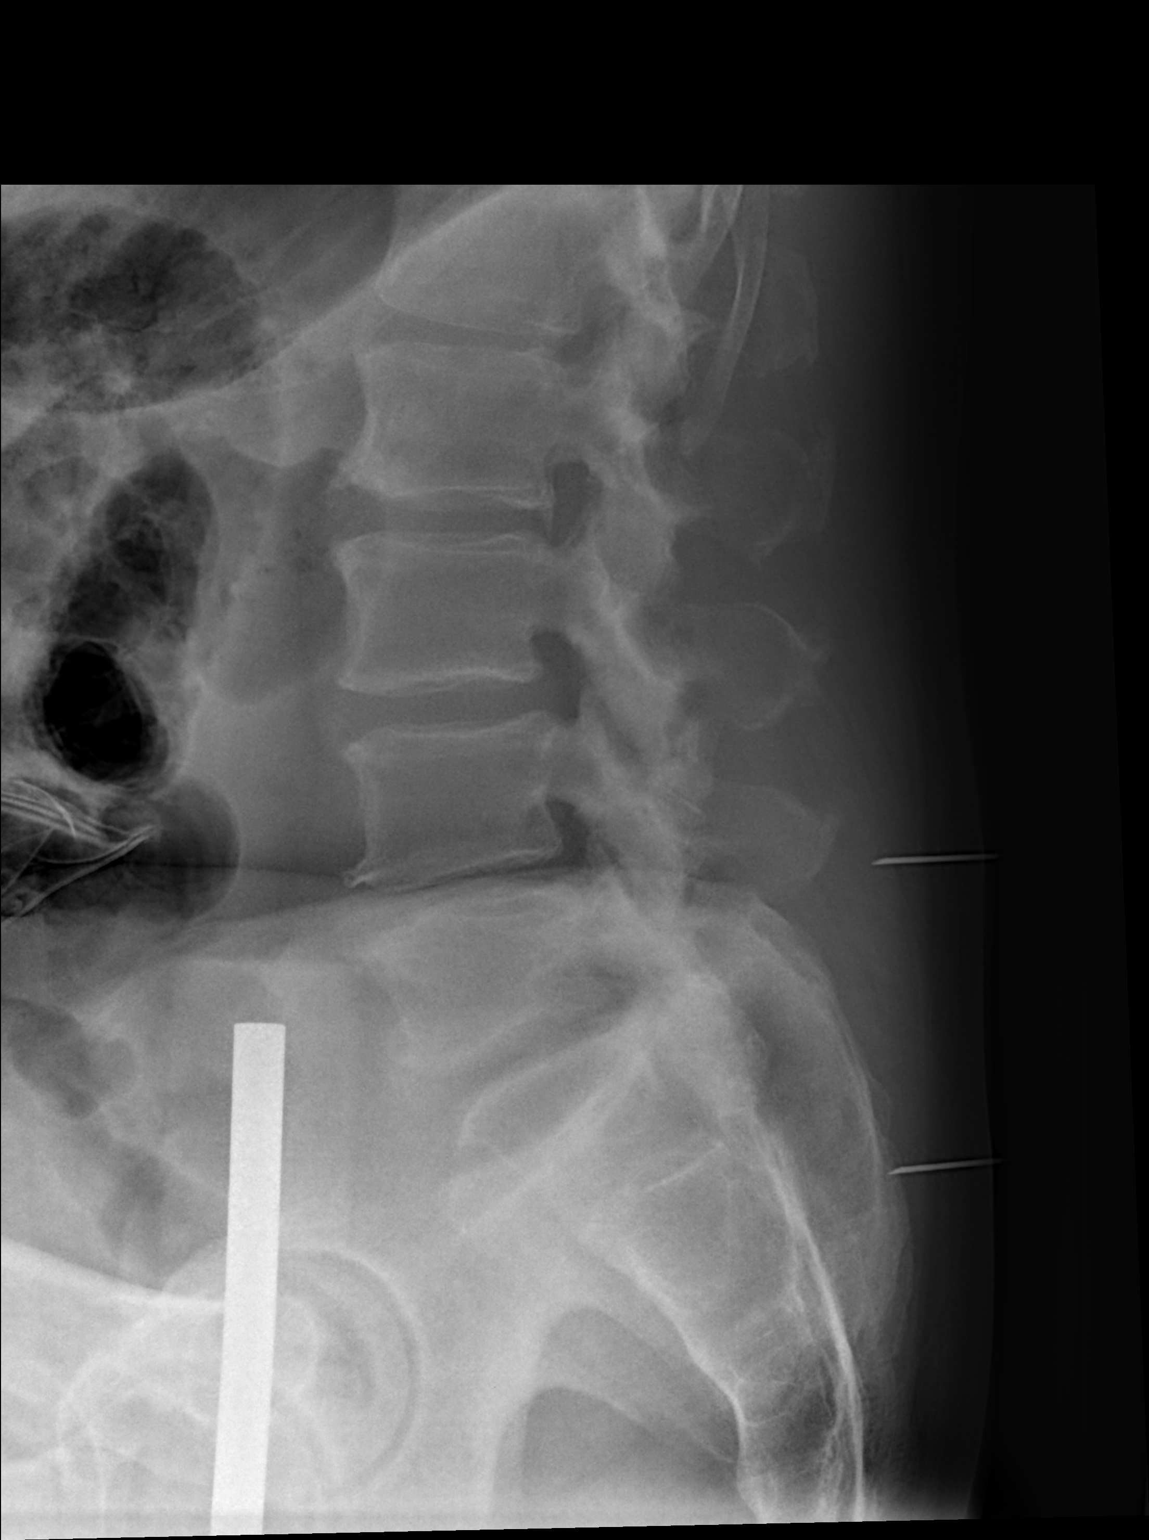

[1 of 1 positions shown; findings below may reference images not displayed]

FINDINGS: Lateral lumbar spine was obtained. There is a metallic probe with
the tip posterior to the L4 spinous process at the L4-5 interspace
level. A second probe has its tip at the level of S2 posteriorly. No
fracture or spondylolisthesis. There is moderate disc space
narrowing at L4-5 and L5-S1.
IMPRESSION: Probes with tips as described. Osteoarthritic change, primarily at
L4-5 and L5-S1. No fracture or spondylolisthesis.

## 2014-10-23 IMAGING — CR DG SPINE 1V PORT
1 series · 1 of 1 positions shown · non-contrast
Comparison: Film from earlier in the same day

CLINICAL DATA: Lumbar decompression at L5-S1

EXAM:
PORTABLE SPINE - 1 VIEW

[lateral]
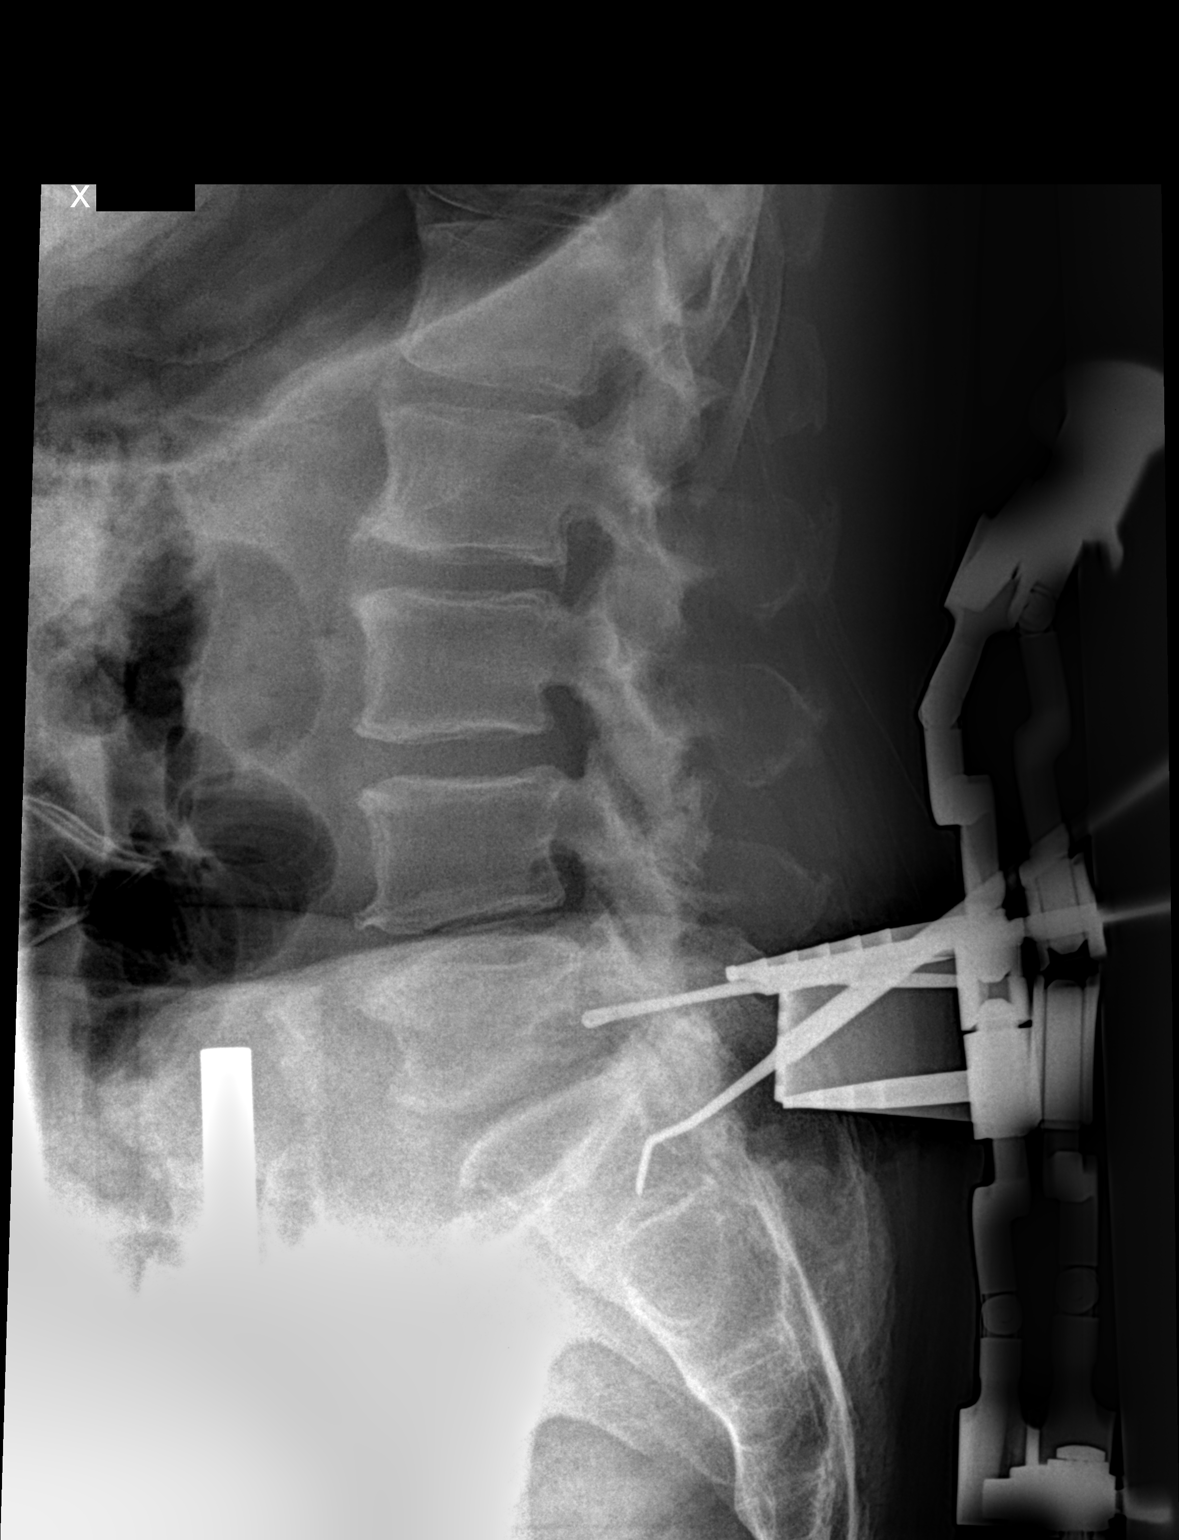

[1 of 1 positions shown; findings below may reference images not displayed]

FINDINGS: Surgical instruments are again noted at the L5-S1 level. The lumbar
vertebrae or numbered according to previous nomenclature.

## 2014-10-23 IMAGING — CR DG SPINE 1V PORT
1 series · 2 of 2 positions shown · non-contrast
Comparison: Film from earlier in the same day

CLINICAL DATA: L5-S1 lumbar laminectomy

EXAM:
PORTABLE SPINE - 1 VIEW

[Series 1: lateral · U · 2 of 2 slices shown]
[im 1/2]
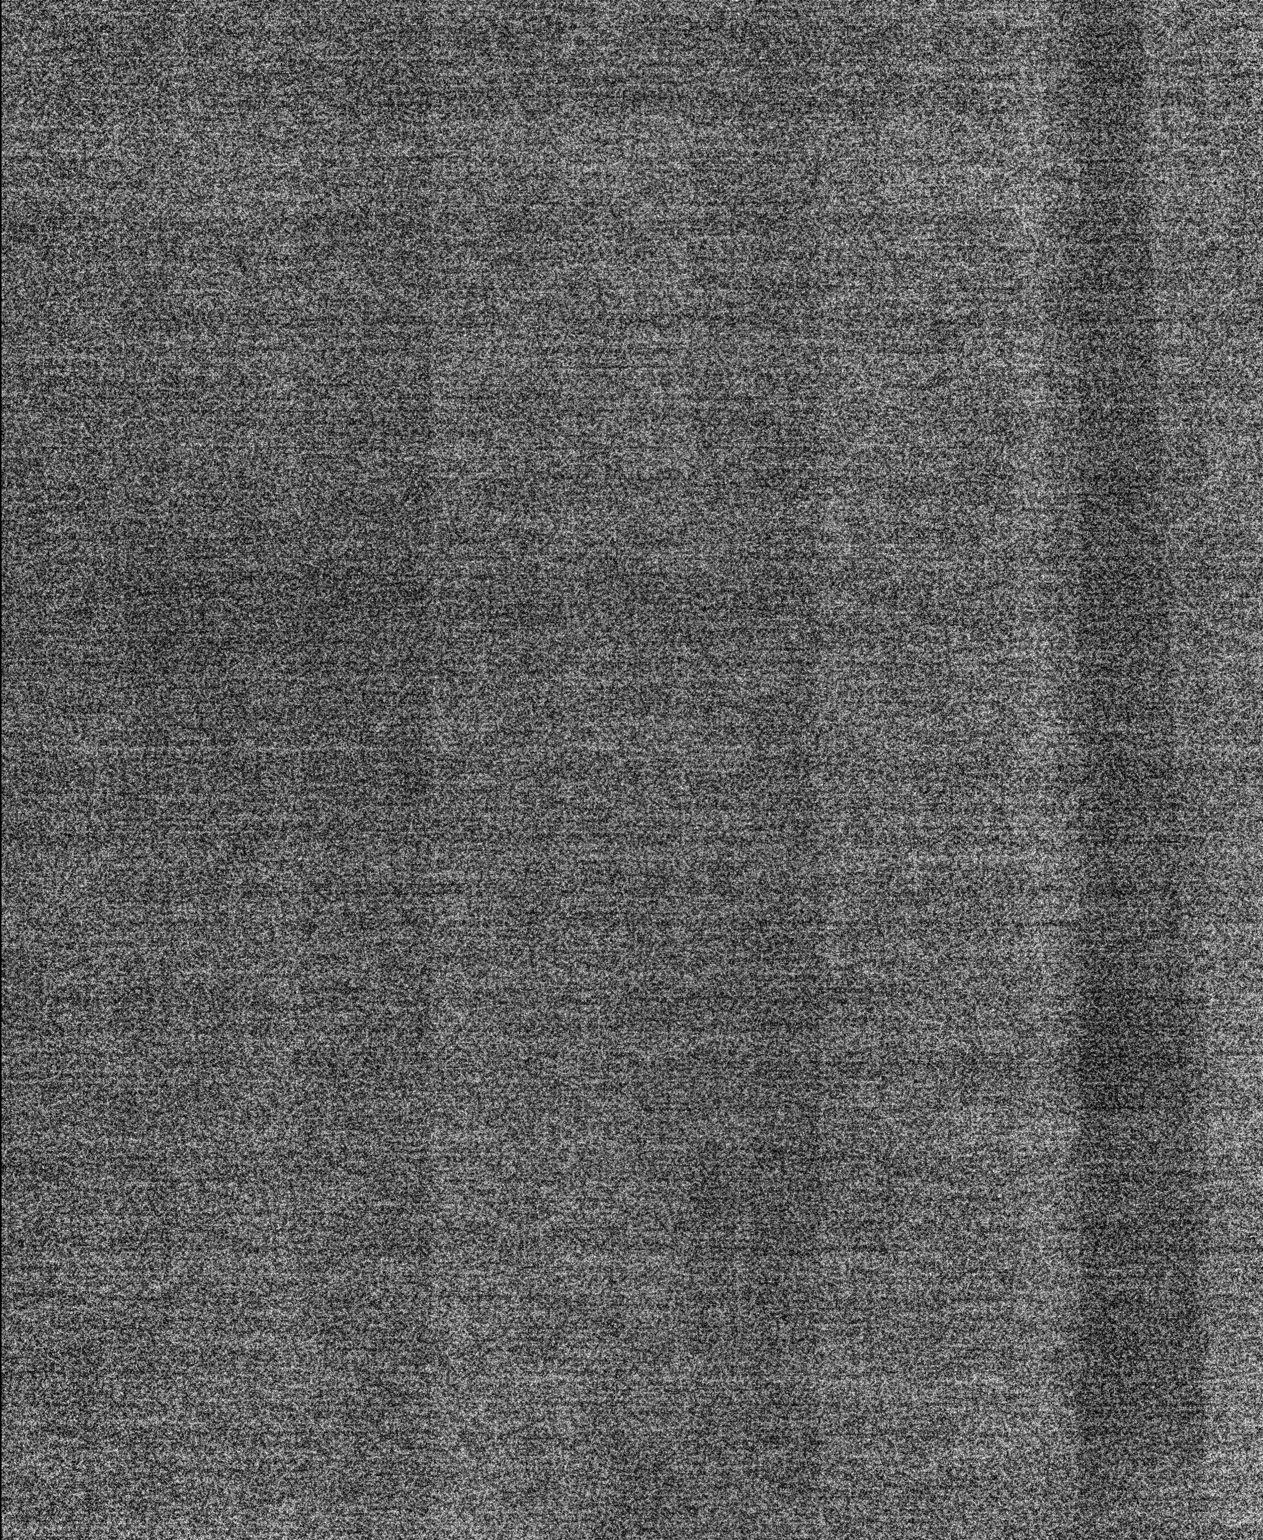
[im 2/2]
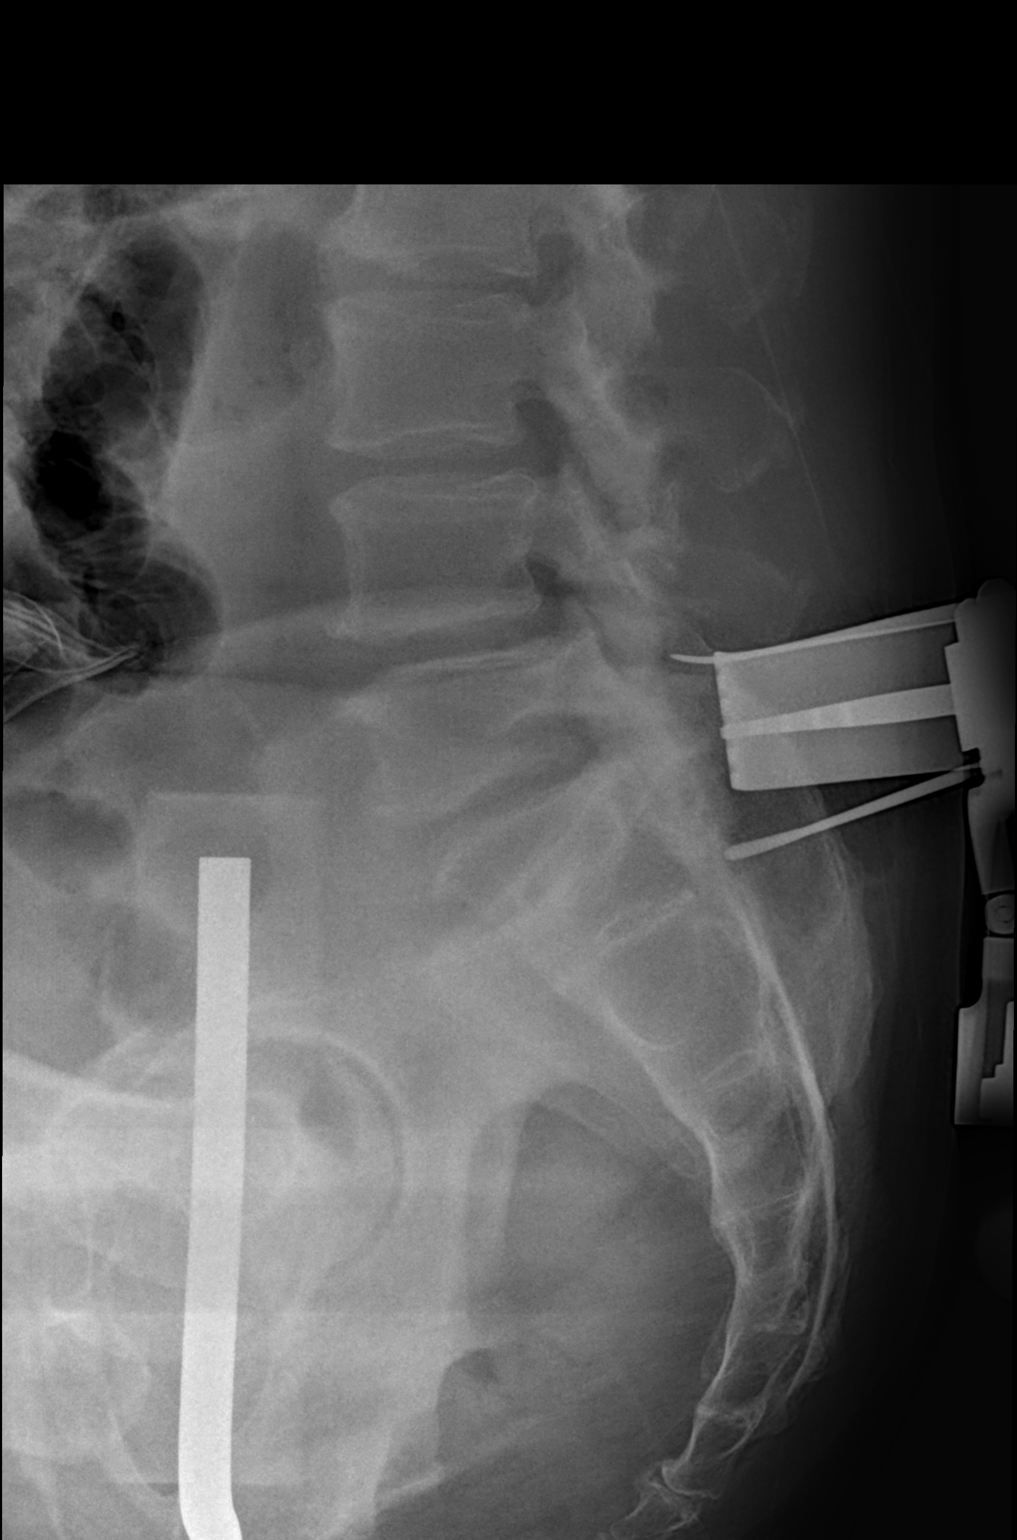

[2 of 2 positions shown; findings below may reference images not displayed]

FINDINGS: There are now surgical instruments identified posterior to the L5-S1
interspace. The spinous processes were labeled as per the request of
the referring clinician.

## 2016-09-18 HISTORY — PX: JOINT REPLACEMENT: SHX530

## 2022-02-24 ENCOUNTER — Other Ambulatory Visit: Payer: Self-pay | Admitting: Orthopedic Surgery

## 2022-02-24 DIAGNOSIS — M545 Low back pain, unspecified: Secondary | ICD-10-CM

## 2022-03-12 ENCOUNTER — Ambulatory Visit
Admission: RE | Admit: 2022-03-12 | Discharge: 2022-03-12 | Disposition: A | Discharge status: Home / Self Care | Payer: Medicare Other | Source: Ambulatory Visit | Attending: Orthopedic Surgery | Admitting: Orthopedic Surgery

## 2022-03-12 DIAGNOSIS — M545 Low back pain, unspecified: Secondary | ICD-10-CM

## 2022-03-17 ENCOUNTER — Other Ambulatory Visit: Payer: Self-pay | Admitting: Orthopedic Surgery

## 2022-03-17 DIAGNOSIS — M542 Cervicalgia: Secondary | ICD-10-CM

## 2022-04-01 ENCOUNTER — Ambulatory Visit
Admission: RE | Admit: 2022-04-01 | Discharge: 2022-04-01 | Disposition: A | Discharge status: Home / Self Care | Payer: Medicare Other | Source: Ambulatory Visit | Attending: Orthopedic Surgery | Admitting: Orthopedic Surgery

## 2022-04-01 DIAGNOSIS — M542 Cervicalgia: Secondary | ICD-10-CM

## 2022-04-18 ENCOUNTER — Other Ambulatory Visit: Payer: Self-pay | Admitting: Orthopedic Surgery

## 2022-05-01 NOTE — Progress Notes (Signed)
Surgical Instructions    Your procedure is scheduled on Thursday August 17.  Report to [facility] Main Entrance "A" at 11:00 A.M., then check in with the Admitting office.  Call this number if you have problems the morning of surgery:  [phone]   If you have any questions prior to your surgery date call [phone]: Open Monday-Friday 8am-4pm    Remember:  Do not eat after midnight the night before your surgery  You may drink clear liquids until 10:00am the morning of your surgery.   Clear liquids allowed are: Water, Non-Citrus Juices (without pulp), Carbonated Beverages, Clear Tea, Black Coffee ONLY (NO MILK, CREAM OR POWDERED CREAMER of any kind), and Gatorade Patient Instructions  The night before surgery:  No food after midnight. ONLY clear liquids after midnight  The day of surgery (if you have diabetes): Drink ONE (1) 12 oz G2 given to you in your pre admission testing appointment by 10am the morning of surgery. Drink in one sitting. Do not sip.  This drink was given to you during your hospital  pre-op appointment visit.  Nothing else to drink after completing the  12 oz bottle of G2.         If you have questions, please contact your surgeon's office.     Take these medicines the morning of surgery with A SIP OF WATER:  amLODipine (NORVASC)  omeprazole (PRILOSEC)  pravastatin (PRAVACHOL)  tamsulosin (FLOMAX)   As of today, STOP taking any Aspirin (unless otherwise instructed by your surgeon) Aleve, Naproxen, Ibuprofen, Motrin, Advil, Goody's, BC's, all herbal medications, fish oil, and all vitamins.  HOW TO MANAGE YOUR DIABETES BEFORE AND AFTER SURGERY  Why is it important to control my blood sugar before and after surgery? Improving blood sugar levels before and after surgery helps healing and can limit problems. A way of improving blood sugar control is eating a healthy diet by:  Eating less sugar and carbohydrates  Increasing activity/exercise  Talking  with your doctor about reaching your blood sugar goals High blood sugars (greater than 180 mg/dL) can raise your risk of infections and slow your recovery, so you will need to focus on controlling your diabetes during the weeks before surgery. Make sure that the doctor who takes care of your diabetes knows about your planned surgery including the date and location.  How do I manage my blood sugar before surgery? Check your blood sugar at least 4 times a day, starting 2 days before surgery, to make sure that the level is not too high or low.  Check your blood sugar the morning of your surgery when you wake up and every 2 hours until you get to the Short Stay unit.  If your blood sugar is less than 70 mg/dL, you will need to treat for low blood sugar: Do not take insulin. Treat a low blood sugar (less than 70 mg/dL) with  cup of clear juice (cranberry or apple), 4 glucose tablets, OR glucose gel. Recheck blood sugar in 15 minutes after treatment (to make sure it is greater than 70 mg/dL). If your blood sugar is not greater than 70 mg/dL on recheck, call [phone] for further instructions. Report your blood sugar to the short stay nurse when you get to Short Stay.  If you are admitted to the hospital after surgery: Your blood sugar will be checked by the staff and you will probably be given insulin after surgery (instead of oral diabetes medicines) to make sure you have good blood  sugar levels. The goal for blood sugar control after surgery is 80-180 mg/dL.           Do not wear jewelry or makeup. Do not wear lotions, powders, perfumes/cologne or deodorant. Do not shave 48 hours prior to surgery.  Men may shave face and neck. Do not bring valuables to the hospital. Do not wear nail polish, gel polish, artificial nails, or any other type of covering on natural nails (fingers and toes) If you have artificial nails or gel coating that need to be removed by a nail salon, please have this removed  prior to surgery. Artificial nails or gel coating may interfere with anesthesia's ability to adequately monitor your vital signs.  [place] is not responsible for any belongings or valuables.    Do NOT Smoke (Tobacco/Vaping)  24 hours prior to your procedure  If you use a CPAP at night, you may bring your mask for your overnight stay.   Contacts, glasses, hearing aids, dentures or partials may not be worn into surgery, please bring cases for these belongings   For patients admitted to the hospital, discharge time will be determined by your treatment team.   Patients discharged the day of surgery will not be allowed to drive home, and someone needs to stay with them for 24 hours.   SURGICAL WAITING ROOM VISITATION Patients having surgery or a procedure may have no more than 2 support people in the waiting area - these visitors may rotate.   Children under the age of 11 must have an adult with them who is not the patient. If the patient needs to stay at the hospital during part of their recovery, the visitor guidelines for inpatient rooms apply. Pre-op nurse will coordinate an appropriate time for 1 support person to accompany patient in pre-op.  This support person may not rotate.   Please refer to the [facility] website for the visitor guidelines for Inpatients (after your surgery is over and you are in a regular room).    Special instructions:    Oral Hygiene is also important to reduce your risk of infection.  Remember - BRUSH YOUR TEETH THE MORNING OF SURGERY WITH YOUR REGULAR TOOTHPASTE   - Preparing For Surgery  Before surgery, you can play an important role. Because skin is not sterile, your skin needs to be as free of germs as possible. You can reduce the number of germs on your skin by washing with CHG (chlorahexidine gluconate) Soap before surgery.  CHG is an antiseptic cleaner which kills germs and bonds with the skin to continue killing germs even after  washing.     Please do not use if you have an allergy to CHG or antibacterial soaps. If your skin becomes reddened/irritated stop using the CHG.  Do not shave (including legs and underarms) for at least 48 hours prior to first CHG shower. It is OK to shave your face.  Please follow these instructions carefully.     Shower the NIGHT BEFORE SURGERY and the MORNING OF SURGERY with CHG Soap.   If you chose to wash your hair, wash your hair first as usual with your normal shampoo. After you shampoo, rinse your hair and body thoroughly to remove the shampoo.  Then [employer] and genitals (private parts) with your normal soap and rinse thoroughly to remove soap.  After that Use CHG Soap as you would any other liquid soap. You can apply CHG directly to the skin and wash gently with  a scrungie or a clean washcloth.   Apply the CHG Soap to your body ONLY FROM THE NECK DOWN.  Do not use on open wounds or open sores. Avoid contact with your eyes, ears, mouth and genitals (private parts). Wash Face and genitals (private parts)  with your normal soap.   Wash thoroughly, paying special attention to the area where your surgery will be performed.  Thoroughly rinse your body with warm water from the neck down.  DO NOT shower/wash with your normal soap after using and rinsing off the CHG Soap.  Pat yourself dry with a CLEAN TOWEL.  Wear CLEAN PAJAMAS to bed the night before surgery  Place CLEAN SHEETS on your bed the night before your surgery  DO NOT SLEEP WITH PETS.   Day of Surgery:  Take a shower with CHG soap. Wear Clean/Comfortable clothing the morning of surgery Do not apply any deodorants/lotions.   Remember to brush your teeth WITH YOUR REGULAR TOOTHPASTE.    If you received a COVID test during your pre-op visit, it is requested that you wear a mask when out in public, stay away from anyone that may not be feeling well, and notify your surgeon if you develop symptoms. If you have been in  contact with anyone that has tested positive in the last 10 days, please notify your surgeon.    Please read over the following fact sheets that you were given.

## 2022-05-02 ENCOUNTER — Encounter (HOSPITAL_COMMUNITY): Payer: Self-pay

## 2022-05-02 ENCOUNTER — Other Ambulatory Visit: Payer: Self-pay

## 2022-05-02 ENCOUNTER — Encounter (HOSPITAL_COMMUNITY)
Admission: RE | Admit: 2022-05-02 | Discharge: 2022-05-02 | Disposition: A | Discharge status: Home / Self Care | Payer: Medicare Other | Source: Ambulatory Visit | Attending: Orthopedic Surgery | Admitting: Orthopedic Surgery

## 2022-05-02 VITALS — BP 141/69 | HR 71 | Temp 98.2°F | Resp 17 | Ht 69.0 in | Wt 231.0 lb

## 2022-05-02 DIAGNOSIS — K219 Gastro-esophageal reflux disease without esophagitis: Secondary | ICD-10-CM | POA: Diagnosis not present

## 2022-05-02 DIAGNOSIS — M4712 Other spondylosis with myelopathy, cervical region: Secondary | ICD-10-CM | POA: Diagnosis not present

## 2022-05-02 DIAGNOSIS — G9520 Unspecified cord compression: Secondary | ICD-10-CM | POA: Diagnosis not present

## 2022-05-02 DIAGNOSIS — M5001 Cervical disc disorder with myelopathy,  high cervical region: Secondary | ICD-10-CM | POA: Insufficient documentation

## 2022-05-02 DIAGNOSIS — R7303 Prediabetes: Secondary | ICD-10-CM | POA: Diagnosis not present

## 2022-05-02 DIAGNOSIS — Z8673 Personal history of transient ischemic attack (TIA), and cerebral infarction without residual deficits: Secondary | ICD-10-CM | POA: Insufficient documentation

## 2022-05-02 DIAGNOSIS — Z87891 Personal history of nicotine dependence: Secondary | ICD-10-CM | POA: Insufficient documentation

## 2022-05-02 DIAGNOSIS — G4733 Obstructive sleep apnea (adult) (pediatric): Secondary | ICD-10-CM | POA: Diagnosis not present

## 2022-05-02 DIAGNOSIS — Z01818 Encounter for other preprocedural examination: Secondary | ICD-10-CM

## 2022-05-02 DIAGNOSIS — I10 Essential (primary) hypertension: Secondary | ICD-10-CM | POA: Diagnosis not present

## 2022-05-02 HISTORY — DX: Anosmia: R43.0

## 2022-05-02 HISTORY — DX: Sleep apnea, unspecified: G47.30

## 2022-05-02 HISTORY — DX: Essential (primary) hypertension: I10

## 2022-05-02 HISTORY — DX: Malignant neoplasm of prostate: C61

## 2022-05-02 HISTORY — DX: Other forms of dyspnea: R06.09

## 2022-05-02 HISTORY — DX: Cerebral infarction, unspecified: I63.9

## 2022-05-02 HISTORY — DX: Nontoxic multinodular goiter: E04.2

## 2022-05-02 LAB — CBC
HCT: 45.2 % (ref 39.0–52.0)
Hemoglobin: 14.9 g/dL (ref 13.0–17.0)
MCH: 30.2 pg (ref 26.0–34.0)
MCHC: 33 g/dL (ref 30.0–36.0)
MCV: 91.5 fL (ref 80.0–100.0)
Platelets: 182 10*3/uL (ref 150–400)
RBC: 4.94 MIL/uL (ref 4.22–5.81)
RDW: 13.8 % (ref 11.5–15.5)
WBC: 5.8 10*3/uL (ref 4.0–10.5)
nRBC: 0 % (ref 0.0–0.2)

## 2022-05-02 LAB — BASIC METABOLIC PANEL
Anion gap: 9 (ref 5–15)
BUN: 13 mg/dL (ref 8–23)
CO2: 28 mmol/L (ref 22–32)
Calcium: 9.9 mg/dL (ref 8.9–10.3)
Chloride: 105 mmol/L (ref 98–111)
Creatinine, Ser: 1.27 mg/dL — ABNORMAL HIGH (ref 0.61–1.24)
GFR, Estimated: >60 mL/min (ref >60)
Glucose, Bld: 109 mg/dL — ABNORMAL HIGH (ref 70–99)
Potassium: 4.2 mmol/L (ref 3.5–5.1)
Sodium: 142 mmol/L (ref 135–145)

## 2022-05-02 LAB — SURGICAL PCR SCREEN

## 2022-05-02 NOTE — Anesthesia Preprocedure Evaluation (Signed)
Anesthesia Evaluation  Patient identified by MRN, date of birth, ID band Patient awake    Reviewed: Allergy & Precautions, NPO status , Patient's Chart, lab work & pertinent test results  Airway Mallampati: III  TM Distance: >3 FB Neck ROM: Full    Dental  (+) Dental Advisory Given   Pulmonary sleep apnea , former smoker,    breath sounds clear to auscultation       Cardiovascular hypertension, Pt. on medications  Rhythm:Regular Rate:Normal  Nuclear stress test 06/09/21 ([facility]): Conclusions: 1.) LIMITATIONS: None.  2.) MYOCARDIAL PERFUSION: Normal.  3.) LEFT VENTRICULAR EJECTION FRACTION: Normal.  4.) REGIONAL WALL MOTION: Normal.  Echo 12/21/20: SUMMARY  The left ventricular size is normal.  There is borderline concentric left ventricular hypertrophy.  LV ejection fraction = 55-60%.  The left ventricular wall motion is normal.  Left ventricular filling pattern is prolonged relaxation.  The right ventricle is normal in size and function.  There is no significant valvular stenosis or regurgitation.  The IVC is normal in size with an inspiratory collapse of greater then  50%, suggesting normal right atrial pressure.  There is no pericardial effusion.  There is no comparison study available.     Neuro/Psych  Neuromuscular disease CVA    GI/Hepatic Neg liver ROS, GERD  ,  Endo/Other  negative endocrine ROS  Renal/GU Renal InsufficiencyRenal disease     Musculoskeletal  (+) Arthritis ,   Abdominal   Peds  Hematology negative hematology ROS (+)   Anesthesia Other Findings   Reproductive/Obstetrics                            Lab Results  Component Value Date   WBC 5.8 05/02/2022   HGB 14.9 05/02/2022   HCT 45.2 05/02/2022   MCV 91.5 05/02/2022   PLT 182 05/02/2022   Lab Results  Component Value Date   CREATININE 1.27 (H) 05/02/2022   BUN 13 05/02/2022   NA 142 05/02/2022   K  4.2 05/02/2022   CL 105 05/02/2022   CO2 28 05/02/2022    Anesthesia Physical Anesthesia Plan  ASA: 3  Anesthesia Plan: General   Post-op Pain Management: Tylenol PO (pre-op)* and Minimal or no pain anticipated   Induction: Intravenous  PONV Risk Score and Plan: 2 and Dexamethasone, Ondansetron and Treatment may vary due to age or medical condition  Airway Management Planned: Oral ETT and Video Laryngoscope Planned  Additional Equipment: None  Intra-op Plan:   Post-operative Plan: Extubation in OR  Informed Consent: I have reviewed the patients History and Physical, chart, labs and discussed the procedure including the risks, benefits and alternatives for the proposed anesthesia with the patient or authorized representative who has indicated his/her understanding and acceptance.     Dental advisory given  Plan Discussed with: CRNA  Anesthesia Plan Comments: ( )      Anesthesia Quick Evaluation

## 2022-05-02 NOTE — Progress Notes (Signed)
Anesthesia PAT Evaluation:  Case: [id number] Date/Time: 05/04/22 1345   Procedure: ANTERIOR CERVICAL DECOMPRESSION FUSION CERVICAL 3- CERVICAL 4 WITH INSTRUMENTATION AND ALLOGRAFT   Anesthesia type: General   Pre-op diagnosis: Cervical myelopathy with a large C3-C4 disc herniation, resulting in severe spinal cord compression   Location: [facility] OR ROOM 05 / [place] [facility]   Surgeons: Phylliss Bob, MD       DISCUSSION: Patient is a 71 year old male scheduled for the above procedure.  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    He got established with [facility] on 11/23/20 for atypical chest pain with ED visit on 10/20/20. HS Troponin 6->7. EKG showed new RBBB. CTA was negative for PE. Echo ordered given RBBB. 12/21/20 echo showed borderline concentric LVH, EF 55-60%, prolonged LV relaxation, no significant valvular disease. left ventricular hypertrophy. At his 05/31/21 follow up he reported exertional dyspnea and decrease in his exertional capacity with climbing 6-8 steps at his porch. No palpitations, orthopnea, or LE edema. On magnesium for leg cramps. Given concern for possible anginal equivalent given risk factors and nuclear stress test was ordered and done on 06/09/21 and showed normal perfusion. Last cardiology visit was with Brynda Rim, MD (resident)/Dr. Howell Rucks on 12/06/21 for follow-up. Given recent studies, chest pains thought likely musculoskeletal in nature and dyspnea suspected to be due to obesity and deconditioning.  Lisinopril 5 mg added due to elevated BP and prediabetic  status. One year or as needed for symptoms recommended.   Seen at [facility] by Wendy Poet, PA-C on 05/01/22. BP well controlled on lisinopril. A1c 6.1 (up from 5.9). Intolerant to CPAP and apparently OSA not severe enough to be considered for [facility], so can pursue a dental device through dentistry. In regards to surgery her wrote, "Cervical spinal surgery scheduled. He is cleared for surgery from our perspective."  Seen at PAT visit due to reports of DOE. In review of PCP and cardiology notes, he already underwent work-up as outlined above. In addition, he had PFTs and a sleep study in May 2023 which showed "mild restrictive ventilatory impairment No obstructive disease noted" and mild OSA for which he is intolerance to CPAP. He denied chest pain--only left shoulder discomfort when he sleeps on his left side and feels better after he is up and about. No exertional chest/jaw/or LUE pains. No SOB at rest. He had noted progressive DOE over the past year, probably worse in the past six months, but no significantly changed from when he saw cardiologist in March 2023. He primarily notices DOE when going up his porch steps (~ 8 steps) and has to rest for a couple of minutes. Cardiology had attributed to deconditioning following testing. He also has back pain, and said that between his neck and back issues he has not been able to use his [employer] work-up or free weights for about six months now. He walks on the treadmill for about 7-8 minutes a few times a week and was able to do some weeding recently. He denied syncope, palpitations, edema. Denied known family history of CAD. (He reported that his mother had CVA history and died from a ruptured brain aneurysm; His father died several months  after abdominal hernia surgery from lung "occlusion". He said the lung was described as "rubbery" and does recall hearing terms like pulmonary embolism/PE or pneumonia/consolidation.) He does have know multi-nodular  thyroid, but denied compressive symptoms--denied neck fullness or dysphagia.   He has had gradual DOE with work-up as described, while symptoms are probably worse over the past six months, he did not think significantly so since when he saw cardiologist in March 2023. No additional cardiac testing recommended at that time. He had PCP evaluation on 05/01/22 who medically cleared him for surgery. Reviewed with anesthesiologist Tamela Gammon, MD. Anesthesia team to evaluate on the day of surgery.    VS: BP (!) 141/69   Pulse 71   Temp 36.8 C (Oral)   Resp 17   Ht '5\' 9"'$  (1.753 m)   Wt 104.8 kg   SpO2 97%   BMI 34.11 kg/m  Heart RRR, no murmur. Lungs clear. No conversational dyspnea. Has upper partial and lower full dentures. Had left upper tooth extracted on 04/19/22. No carotid bruits noted.    PROVIDERS: Hedwig Morton, PA is PCP ([facility]) Howell Rucks, MD is cardiologist ([facility]) Baruch Gouty, MD is urologist ([facility]) Tommas Olp, MD is ENT ([facility]). Per 08/09/21 note, patient deferred thyroid nodule FNA and preferred serial [place], so one year follow-up planned. Calcium levels stable since parathyroidectomy.  Plonk, Dian Situ, MD is ENT/Rhinologist ([facility]). See on 09/21/21 for anosmia, ageusia. Symptoms attributed to small vessel ischemic changes. Jiles Prows, MD is a pulmonologist for sleep medicine ([facility])   LABS: Labs reviewed: Acceptable for surgery. (all labs ordered are listed, but only abnormal results are displayed)  Labs Reviewed  BASIC METABOLIC PANEL - Abnormal; Notable for the following components:      Result Value   Glucose, Bld 109 (*)    Creatinine, Ser 1.27 (*)    All other components within normal limits  SURGICAL PCR SCREEN  CBC   A1c 6.1% 05/01/22 ([facility]).    OTHER: Per 05/02/22 [facility] PCP note: Home Sleep Study 02/09/22: "Home PSG 02/09/2022 showed mild OSA with AHI of 8  ecommended AutoPap if positive."  PFTs 02/08/22: "PFTs 02/08/2022 showed a mild  restrictive ventilatory impairment No obstructive disease noted."   IMAGES: MRI C-spine 04/01/22: IMPRESSION: - Cervical and upper thoracic spondylosis, as outlined and with findings most notably as follows. - At C3-C4, there is a large central disc extrusion with cranial and caudal migration. Resultant severe spinal canal stenosis with severe spinal cord flattening. Signal abnormality within the spinal cord at this level compatible with edema, and possibly early myelomalacia. Bilateral neural foraminal narrowing (mild right, moderate left). - At C6-C7, there is moderately advanced disc degeneration with mild degenerative endplate edema. Multifactorial moderate spinal canal stenosis with contact upon the dorsal and ventral spinal cord, and minimal ventral spinal cord flattening. Severe bilateral neural foraminal narrowing. - Additional levels of spinal canal stenosis, as described and greatest at C5-C6 and T1-T2. Additional sites of foraminal stenosis, as detailed and greatest on the left at C2-C3, bilaterally at C4-C5, on the left at C5-C6 and on the left at C7-T1 (moderate at these sites).   MRI L-spine 03/12/22: IMPRESSION: 1. Degenerative lumbar spondylosis with multilevel disc disease and facet disease. 2. Multilevel multifactorial spinal, lateral recess and foraminal stenosis as discussed above at the individual levels. The most significant levels are L3-4 and L4-5. 3. Broad-based left foraminal and extraforaminal disc protrusion at L4-5 contacting and displacing the left L4 nerve root. 4. Moderate bilateral foraminal stenosis at  L5-S1, left greater than right.  US Thyroid 08/09/21 ([facility]): Impression: Enlarged multinodular thyroid with a TI RADS 4 lesion in the mid left lobe that based on size criteria merits further evaluation with fine needle aspiration. A second left-sided lesion requires follow-up in 12 months.   - Patient opted for serial [place] in 1 year instead of  FNA.  CTA Chest 10/20/20 ([facility]): Impression: 1.  No evidence of acute pulmonary embolism to the level proximal segmental arteries. No acute abnormalities within the chest.  2.  Multinodular thyroid for which follow-up nonemergent thyroid ultrasound is recommended for further evaluation.   EKG: EKG 05/02/21: NSR  EKG 11/23/20 ([facility]): Per Narrative in [facility]: Sinus rhythm with sinus arrhythmia  Right bundle branch block  When compared with ECG of 20-Oct-2020 17:44,  No significant change was found  Confirmed by fellow Marzetta Board [phone]) on 11/24/2020 11:07:42 AM  Confirmed by [place] [phone]) on 11/24/2020 8:35:08 PM    CV: Nuclear stress test 06/09/21 ([facility]): Conclusions: 1.) LIMITATIONS: None.  2.) MYOCARDIAL PERFUSION: Normal.  3.) LEFT VENTRICULAR EJECTION FRACTION: Normal.  4.) REGIONAL WALL MOTION: Normal.  Echo 12/21/20: SUMMARY  The left ventricular size is normal.  There is borderline concentric left ventricular hypertrophy.  LV ejection fraction = 55-60%.  The left ventricular wall motion is normal.  Left ventricular filling pattern is prolonged relaxation.  The right ventricle is normal in size and function.  There is no significant valvular stenosis or regurgitation.  The IVC is normal in size with an inspiratory collapse of greater then  50%, suggesting normal right atrial pressure.  There is no pericardial effusion.  There is no comparison study available.    Past Medical History:  Diagnosis Date   Anosmia    ENT thought likely related to chronic small vessel ischemic changes (09/21/21)   Arthritis    CVA (cerebral vascular accident) ([place])    right occipital lobe infarct 05/06/17 MRI   Dry cough 02/06/2014   Exertional dyspnea    GERD (gastroesophageal reflux disease)    Hypertension    Multinodular thyroid    Prediabetes    Prostate cancer ([place])    s/p EBRT 05/2015   Sleep apnea    02/07/22 HST: mild OSA w/ AHI 8, intolerant to  CPAP (05/02/22)    Past Surgical History:  Procedure Laterality Date   BACK SURGERY  09/18/2009   lumbar   FRACTURE SURGERY Right    ORIF ankle   LUMBAR LAMINECTOMY/DECOMPRESSION MICRODISCECTOMY Right 02/11/2014   Procedure: MICRO LUMBAR DECOMPRESSION, MICRODISCECTOMY,  LATERAL MASS FUSION WITH AUTOGRAFT  LUMBAR FIVE TO SACRAL ONE  ON THE RIGHT, REDO MICRODISCECTOMY LUMBAR FOUR TO LUMBAR FIVE;  Surgeon: Johnn Hai, MD;  Location: [facility];  Service: Orthopedics;  Laterality: Right;   PARATHYROIDECTOMY      MEDICATIONS:  amLODipine (NORVASC) 10 MG tablet   aspirin EC 81 MG tablet   Cholecalciferol (VITAMIN D) 50 MCG (2000 UT) CAPS   Coenzyme Q10 (COQ10) 100 MG CAPS   lisinopril (ZESTRIL) 5 MG tablet   magnesium oxide (MAG-OX) 400 (240 Mg) MG tablet   omeprazole (PRILOSEC) 20 MG capsule   pramipexole (MIRAPEX) 0.125 MG tablet   pravastatin (PRAVACHOL) 40 MG tablet   tadalafil (CIALIS) 5 MG tablet   tamsulosin (FLOMAX) 0.4 MG CAPS capsule   vitamin E 180 MG (400 UNITS) capsule   No current facility-administered medications for this encounter.   ASA on hold for > 7 days.  Myra Gianotti, PA-C Surgical Short Stay/Anesthesiology [facility] Phone [phone] [facility] Phone [phone] 05/02/2022 4:57 PM

## 2022-05-02 NOTE — Progress Notes (Signed)
PCP - Dr. Wendy Poet Cardiologist - denies  PPM/ICD - n/a  Chest x-ray - n/a EKG - 05/02/22 Stress Test - 06/09/21 ECHO - 12/21/20 Cardiac Cath -denies   Sleep Study - OSA+ CPAP - unable to tolerate  Does not check consistently. Pt says his wife checks CBG maybe 1 time a month. Last A1C was 6.1 on 05/01/22.  Blood Thinner Instructions: n/a Aspirin Instructions: Pt stated he stopped ASA 8 days ago.  ERAS Protcol -Clear liquids until 1000 DOS PRE-SURGERY Ensure or G2- G2 provided.  COVID TEST- n/a  Anesthesia review: Yes, recent increase in SOB. Myra Gianotti, PA-C assessed pt during PAT appt. EKG normal. Recent evaluation from PCP on 05/01/22.   Patient denies shortness of breath, fever, cough and chest pain at PAT appointment   All instructions explained to the patient, with a verbal understanding of the material. Patient agrees to go over the instructions while at home for a better understanding. Patient also instructed to self quarantine after being tested for COVID-19. The opportunity to ask questions was provided.

## 2022-05-02 NOTE — Progress Notes (Signed)
Patient will need a repeat surgical pcr on day of surgery due to invalid result.

## 2022-05-04 ENCOUNTER — Ambulatory Visit (HOSPITAL_COMMUNITY): Payer: Medicare Other

## 2022-05-04 ENCOUNTER — Encounter (HOSPITAL_COMMUNITY)
Admission: RE | Disposition: A | Discharge status: Home / Self Care | Payer: Self-pay | Source: Home / Self Care | Attending: Orthopedic Surgery

## 2022-05-04 ENCOUNTER — Ambulatory Visit (HOSPITAL_COMMUNITY): Payer: Medicare Other | Admitting: Vascular Surgery

## 2022-05-04 ENCOUNTER — Ambulatory Visit (HOSPITAL_BASED_OUTPATIENT_CLINIC_OR_DEPARTMENT_OTHER): Payer: Medicare Other | Admitting: Anesthesiology

## 2022-05-04 ENCOUNTER — Other Ambulatory Visit: Payer: Self-pay

## 2022-05-04 ENCOUNTER — Encounter (HOSPITAL_COMMUNITY): Payer: Self-pay | Admitting: Orthopedic Surgery

## 2022-05-04 ENCOUNTER — Ambulatory Visit (HOSPITAL_COMMUNITY)
Admission: RE | Admit: 2022-05-04 | Discharge: 2022-05-04 | Disposition: A | Discharge status: Home / Self Care | Payer: Medicare Other | Attending: Orthopedic Surgery | Admitting: Orthopedic Surgery

## 2022-05-04 DIAGNOSIS — I10 Essential (primary) hypertension: Secondary | ICD-10-CM | POA: Diagnosis not present

## 2022-05-04 DIAGNOSIS — R7303 Prediabetes: Secondary | ICD-10-CM | POA: Insufficient documentation

## 2022-05-04 DIAGNOSIS — G4730 Sleep apnea, unspecified: Secondary | ICD-10-CM | POA: Insufficient documentation

## 2022-05-04 DIAGNOSIS — Z87891 Personal history of nicotine dependence: Secondary | ICD-10-CM | POA: Diagnosis not present

## 2022-05-04 DIAGNOSIS — M5001 Cervical disc disorder with myelopathy,  high cervical region: Secondary | ICD-10-CM | POA: Diagnosis not present

## 2022-05-04 DIAGNOSIS — M4712 Other spondylosis with myelopathy, cervical region: Secondary | ICD-10-CM

## 2022-05-04 DIAGNOSIS — Z8673 Personal history of transient ischemic attack (TIA), and cerebral infarction without residual deficits: Secondary | ICD-10-CM | POA: Insufficient documentation

## 2022-05-04 DIAGNOSIS — G709 Myoneural disorder, unspecified: Secondary | ICD-10-CM | POA: Insufficient documentation

## 2022-05-04 DIAGNOSIS — G9520 Unspecified cord compression: Secondary | ICD-10-CM

## 2022-05-04 DIAGNOSIS — K219 Gastro-esophageal reflux disease without esophagitis: Secondary | ICD-10-CM | POA: Insufficient documentation

## 2022-05-04 DIAGNOSIS — M4802 Spinal stenosis, cervical region: Secondary | ICD-10-CM | POA: Diagnosis not present

## 2022-05-04 DIAGNOSIS — Z01818 Encounter for other preprocedural examination: Secondary | ICD-10-CM

## 2022-05-04 DIAGNOSIS — N289 Disorder of kidney and ureter, unspecified: Secondary | ICD-10-CM

## 2022-05-04 HISTORY — PX: ANTERIOR CERVICAL DECOMP/DISCECTOMY FUSION: SHX1161

## 2022-05-04 LAB — GLUCOSE, CAPILLARY: Glucose-Capillary: 107 mg/dL — ABNORMAL HIGH (ref 70–99)

## 2022-05-04 LAB — SURGICAL PCR SCREEN
MRSA, PCR: NEGATIVE
Staphylococcus aureus: NEGATIVE

## 2022-05-04 SURGERY — ANTERIOR CERVICAL DECOMPRESSION/DISCECTOMY FUSION 1 LEVEL
Anesthesia: General | Site: Spine Cervical

## 2022-05-04 MED ORDER — CEFAZOLIN SODIUM-DEXTROSE 2-4 GM/100ML-% IV SOLN
INTRAVENOUS | Status: AC
  Filled 2022-05-04: qty 100

## 2022-05-04 MED ORDER — CHLORHEXIDINE GLUCONATE 0.12 % MT SOLN: 15.0000 mL | Freq: Once | OROMUCOSAL | Status: AC

## 2022-05-04 MED ORDER — PHENYLEPHRINE 80 MCG/ML (10ML) SYRINGE FOR IV PUSH (FOR BLOOD PRESSURE SUPPORT)
PREFILLED_SYRINGE | INTRAVENOUS | Status: DC | PRN
  Administered 2022-05-04: 160 ug via INTRAVENOUS
  Administered 2022-05-04: 80 ug via INTRAVENOUS

## 2022-05-04 MED ORDER — PHENYLEPHRINE HCL-NACL 20-0.9 MG/250ML-% IV SOLN
INTRAVENOUS | Status: DC | PRN
  Administered 2022-05-04: 25 ug/min via INTRAVENOUS

## 2022-05-04 MED ORDER — FENTANYL CITRATE (PF) 250 MCG/5ML IJ SOLN
INTRAMUSCULAR | Status: DC | PRN
  Administered 2022-05-04: 50 ug via INTRAVENOUS
  Administered 2022-05-04: 100 ug via INTRAVENOUS

## 2022-05-04 MED ORDER — FENTANYL CITRATE (PF) 100 MCG/2ML IJ SOLN: 25.0000 ug | INTRAMUSCULAR | Status: DC | PRN

## 2022-05-04 MED ORDER — DEXAMETHASONE SODIUM PHOSPHATE 10 MG/ML IJ SOLN
INTRAMUSCULAR | Status: DC | PRN
  Administered 2022-05-04: 10 mg via INTRAVENOUS

## 2022-05-04 MED ORDER — LACTATED RINGERS IV SOLN: INTRAVENOUS | Status: DC

## 2022-05-04 MED ORDER — ONDANSETRON HCL 4 MG/2ML IJ SOLN
INTRAMUSCULAR | Status: AC
  Filled 2022-05-04: qty 8

## 2022-05-04 MED ORDER — THROMBIN 20000 UNITS EX SOLR
CUTANEOUS | Status: DC | PRN
  Administered 2022-05-04: 20000 [IU] via TOPICAL

## 2022-05-04 MED ORDER — ACETAMINOPHEN 500 MG PO TABS
1000.0000 mg | ORAL_TABLET | Freq: Once | ORAL | Status: AC
  Administered 2022-05-04: 1000 mg via ORAL
  Filled 2022-05-04: qty 2

## 2022-05-04 MED ORDER — BUPIVACAINE-EPINEPHRINE (PF) 0.25% -1:200000 IJ SOLN
INTRAMUSCULAR | Status: AC
  Filled 2022-05-04: qty 30

## 2022-05-04 MED ORDER — MIDAZOLAM HCL 2 MG/2ML IJ SOLN
INTRAMUSCULAR | Status: AC
  Filled 2022-05-04: qty 2

## 2022-05-04 MED ORDER — SUGAMMADEX SODIUM 200 MG/2ML IV SOLN
INTRAVENOUS | Status: DC | PRN
  Administered 2022-05-04: 208.6 mg via INTRAVENOUS

## 2022-05-04 MED ORDER — BUPIVACAINE-EPINEPHRINE 0.25% -1:200000 IJ SOLN
INTRAMUSCULAR | Status: DC | PRN
  Administered 2022-05-04: 5 mL

## 2022-05-04 MED ORDER — ROCURONIUM BROMIDE 10 MG/ML (PF) SYRINGE
PREFILLED_SYRINGE | INTRAVENOUS | Status: DC | PRN
  Administered 2022-05-04: 70 mg via INTRAVENOUS

## 2022-05-04 MED ORDER — LIDOCAINE 2% (20 MG/ML) 5 ML SYRINGE
INTRAMUSCULAR | Status: DC | PRN
  Administered 2022-05-04: 60 mg via INTRAVENOUS

## 2022-05-04 MED ORDER — PROPOFOL 500 MG/50ML IV EMUL
INTRAVENOUS | Status: DC | PRN
  Administered 2022-05-04: 75 ug/kg/min via INTRAVENOUS

## 2022-05-04 MED ORDER — ORAL CARE MOUTH RINSE: 15.0000 mL | Freq: Once | OROMUCOSAL | Status: AC

## 2022-05-04 MED ORDER — FENTANYL CITRATE (PF) 250 MCG/5ML IJ SOLN
INTRAMUSCULAR | Status: AC
  Filled 2022-05-04: qty 5

## 2022-05-04 MED ORDER — CEFAZOLIN SODIUM 1 G IJ SOLR
INTRAMUSCULAR | Status: AC
  Filled 2022-05-04: qty 20

## 2022-05-04 MED ORDER — CHLORHEXIDINE GLUCONATE 0.12 % MT SOLN
OROMUCOSAL | Status: AC
  Administered 2022-05-04: 15 mL via OROMUCOSAL
  Filled 2022-05-04: qty 15

## 2022-05-04 MED ORDER — LIDOCAINE 2% (20 MG/ML) 5 ML SYRINGE
INTRAMUSCULAR | Status: AC
Start: 2022-05-04 — End: ?
  Filled 2022-05-04: qty 15

## 2022-05-04 MED ORDER — THROMBIN 20000 UNITS EX SOLR
CUTANEOUS | Status: AC
  Filled 2022-05-04: qty 20000

## 2022-05-04 MED ORDER — ROCURONIUM BROMIDE 10 MG/ML (PF) SYRINGE
PREFILLED_SYRINGE | INTRAVENOUS | Status: AC
  Filled 2022-05-04: qty 30

## 2022-05-04 MED ORDER — AMISULPRIDE (ANTIEMETIC) 5 MG/2ML IV SOLN: 10.0000 mg | Freq: Once | INTRAVENOUS | Status: DC | PRN

## 2022-05-04 MED ORDER — CEFAZOLIN SODIUM-DEXTROSE 2-4 GM/100ML-% IV SOLN
2.0000 g | INTRAVENOUS | Status: AC
  Administered 2022-05-04: 2 g via INTRAVENOUS

## 2022-05-04 MED ORDER — HEMOSTATIC AGENTS (NO CHARGE) OPTIME
TOPICAL | Status: DC | PRN
  Administered 2022-05-04: 1 via TOPICAL

## 2022-05-04 MED ORDER — PHENYLEPHRINE 80 MCG/ML (10ML) SYRINGE FOR IV PUSH (FOR BLOOD PRESSURE SUPPORT)
PREFILLED_SYRINGE | INTRAVENOUS | Status: AC
  Filled 2022-05-04: qty 10

## 2022-05-04 MED ORDER — HYDROCODONE-ACETAMINOPHEN 5-325 MG PO TABS: 1.0000 | ORAL_TABLET | Freq: Four times a day (QID) | ORAL | 0 refills | Status: DC | PRN

## 2022-05-04 MED ORDER — METHOCARBAMOL 750 MG PO TABS: 750.0000 mg | ORAL_TABLET | Freq: Four times a day (QID) | ORAL | 0 refills | Status: AC | PRN

## 2022-05-04 MED ORDER — ONDANSETRON HCL 4 MG/2ML IJ SOLN
INTRAMUSCULAR | Status: DC | PRN
  Administered 2022-05-04: 4 mg via INTRAVENOUS

## 2022-05-04 MED ORDER — DEXAMETHASONE SODIUM PHOSPHATE 10 MG/ML IJ SOLN
INTRAMUSCULAR | Status: AC
Start: 2022-05-04 — End: ?
  Filled 2022-05-04: qty 3

## 2022-05-04 MED ORDER — MIDAZOLAM HCL 2 MG/2ML IJ SOLN
INTRAMUSCULAR | Status: DC | PRN
  Administered 2022-05-04: 2 mg via INTRAVENOUS

## 2022-05-04 MED ORDER — PROPOFOL 10 MG/ML IV BOLUS
INTRAVENOUS | Status: DC | PRN
  Administered 2022-05-04: 150 mg via INTRAVENOUS

## 2022-05-04 MED ORDER — 0.9 % SODIUM CHLORIDE (POUR BTL) OPTIME
TOPICAL | Status: DC | PRN
  Administered 2022-05-04 (×2): 1000 mL

## 2022-05-04 MED ORDER — POVIDONE-IODINE 7.5 % EX SOLN
Freq: Once | CUTANEOUS | Status: DC
  Filled 2022-05-04: qty 118

## 2022-05-04 SURGICAL SUPPLY — 77 items
BAG COUNTER SPONGE SURGICOUNT (BAG) ×1 IMPLANT
BENZOIN TINCTURE PRP APPL 2/3 (GAUZE/BANDAGES/DRESSINGS) ×1 IMPLANT
BIT DRILL NEURO 2X3.1 SFT TUCH (MISCELLANEOUS) ×1 IMPLANT
BIT DRILL SRG 14X2.2XFLT CHK (BIT) IMPLANT
BIT DRL SRG 14X2.2XFLT CHK (BIT) ×1
BLADE CLIPPER SURG (BLADE) ×1 IMPLANT
BLADE SURG 15 STRL LF DISP TIS (BLADE) ×1 IMPLANT
BLADE SURG 15 STRL SS (BLADE) ×1
BONE VIVIGEN FORMABLE 1.3CC (Bone Implant) ×1 IMPLANT
CARTRIDGE OIL MAESTRO DRILL (MISCELLANEOUS) ×1 IMPLANT
CORD BIPOLAR FORCEPS 12FT (ELECTRODE) ×1 IMPLANT
COVER SURGICAL LIGHT HANDLE (MISCELLANEOUS) ×1 IMPLANT
DIFFUSER DRILL AIR PNEUMATIC (MISCELLANEOUS) ×1 IMPLANT
DRAIN JACKSON RD 7FR 3/32 (WOUND CARE) IMPLANT
DRAPE C-ARM 42X72 X-RAY (DRAPES) ×1 IMPLANT
DRAPE POUCH INSTRU U-SHP 10X18 (DRAPES) ×1 IMPLANT
DRAPE SURG 17X23 STRL (DRAPES) ×3 IMPLANT
DRILL BIT SKYLINE 14MM (BIT) ×1
DRILL NEURO 2X3.1 SOFT TOUCH (MISCELLANEOUS) ×1
DURAPREP 26ML APPLICATOR (WOUND CARE) ×1 IMPLANT
ELECT COATED BLADE 2.86 ST (ELECTRODE) ×1 IMPLANT
ELECT REM PT RETURN 9FT ADLT (ELECTROSURGICAL) ×1
ELECTRODE REM PT RTRN 9FT ADLT (ELECTROSURGICAL) ×1 IMPLANT
EVACUATOR SILICONE 100CC (DRAIN) IMPLANT
GAUZE 4X4 16PLY ~~LOC~~+RFID DBL (SPONGE) ×1 IMPLANT
GAUZE SPONGE 4X4 12PLY STRL (GAUZE/BANDAGES/DRESSINGS) ×1 IMPLANT
GLOVE BIO SURGEON STRL SZ7 (GLOVE) ×1 IMPLANT
GLOVE BIO SURGEON STRL SZ8 (GLOVE) ×1 IMPLANT
GLOVE BIOGEL PI IND STRL 7.0 (GLOVE) ×2 IMPLANT
GLOVE BIOGEL PI IND STRL 8 (GLOVE) ×1 IMPLANT
GLOVE BIOGEL PI INDICATOR 7.0 (GLOVE) ×2
GLOVE BIOGEL PI INDICATOR 8 (GLOVE) ×1
GLOVE SURG ENC MOIS LTX SZ6.5 (GLOVE) ×1 IMPLANT
GOWN STRL REUS W/ TWL LRG LVL3 (GOWN DISPOSABLE) ×1 IMPLANT
GOWN STRL REUS W/ TWL XL LVL3 (GOWN DISPOSABLE) ×1 IMPLANT
GOWN STRL REUS W/TWL LRG LVL3 (GOWN DISPOSABLE) ×1
GOWN STRL REUS W/TWL XL LVL3 (GOWN DISPOSABLE) ×1
GRAFT BNE MATRIX VG FRMBL SM 1 (Bone Implant) IMPLANT
INTERLOCK LRDTC CRVCL VBR 8MM (Peek) IMPLANT
IV CATH 14GX2 1/4 (CATHETERS) ×1 IMPLANT
KIT BASIN OR (CUSTOM PROCEDURE TRAY) ×1 IMPLANT
KIT TURNOVER KIT B (KITS) ×1 IMPLANT
LORDOTIC CERVICAL VBR 8MM SM (Peek) ×1 IMPLANT
MANIFOLD NEPTUNE II (INSTRUMENTS) ×1 IMPLANT
NDL PRECISIONGLIDE 27X1.5 (NEEDLE) ×1 IMPLANT
NDL SPNL 20GX3.5 QUINCKE YW (NEEDLE) ×1 IMPLANT
NEEDLE PRECISIONGLIDE 27X1.5 (NEEDLE) ×1 IMPLANT
NEEDLE SPNL 20GX3.5 QUINCKE YW (NEEDLE) ×1 IMPLANT
NS IRRIG 1000ML POUR BTL (IV SOLUTION) ×1 IMPLANT
OIL CARTRIDGE MAESTRO DRILL (MISCELLANEOUS) ×1
PACK ORTHO CERVICAL (CUSTOM PROCEDURE TRAY) ×1 IMPLANT
PAD ARMBOARD 7.5X6 YLW CONV (MISCELLANEOUS) ×2 IMPLANT
PATTIES SURGICAL .5 X.5 (GAUZE/BANDAGES/DRESSINGS) IMPLANT
PATTIES SURGICAL .5 X1 (DISPOSABLE) IMPLANT
PIN DISTRACTION 14 (PIN) IMPLANT
PLATE SKYLINE 12MM (Plate) IMPLANT
POSITIONER HEAD DONUT 9IN (MISCELLANEOUS) ×1 IMPLANT
SCREW SKYLINE VAR OS 14MM (Screw) IMPLANT
SOL ANTI FOG 6CC (MISCELLANEOUS) IMPLANT
SOLUTION ANTI FOG 6CC (MISCELLANEOUS) ×1
SPONGE INTESTINAL PEANUT (DISPOSABLE) ×1 IMPLANT
SPONGE SURGIFOAM ABS GEL 100 (HEMOSTASIS) IMPLANT
STRIP CLOSURE SKIN 1/2X4 (GAUZE/BANDAGES/DRESSINGS) ×1 IMPLANT
SURGIFLO W/THROMBIN 8M KIT (HEMOSTASIS) IMPLANT
SUT MNCRL AB 4-0 PS2 18 (SUTURE) ×1 IMPLANT
SUT SILK 4 0 (SUTURE)
SUT SILK 4-0 18XBRD TIE 12 (SUTURE) IMPLANT
SUT VIC AB 2-0 CT2 18 VCP726D (SUTURE) ×1 IMPLANT
SYR BULB IRRIG 60ML STRL (SYRINGE) ×1 IMPLANT
SYR CONTROL 10ML LL (SYRINGE) ×2 IMPLANT
TAPE CLOTH 4X10 WHT NS (GAUZE/BANDAGES/DRESSINGS) ×1 IMPLANT
TAPE CLOTH SURG 4X10 WHT LF (GAUZE/BANDAGES/DRESSINGS) IMPLANT
TAPE UMBILICAL COTTON 1/8X30 (MISCELLANEOUS) ×1 IMPLANT
TOWEL GREEN STERILE (TOWEL DISPOSABLE) ×1 IMPLANT
TOWEL GREEN STERILE FF (TOWEL DISPOSABLE) ×1 IMPLANT
WATER STERILE IRR 1000ML POUR (IV SOLUTION) ×1 IMPLANT
YANKAUER SUCT BULB TIP NO VENT (SUCTIONS) ×1 IMPLANT

## 2022-05-04 NOTE — H&P (Signed)
PREOPERATIVE H&P  Chief Complaint: Bilateral hand numbness and tingling  HPI: Noah Jensen is a 71 y.o. male who presents with numbness and tingling in his hands  MRI reveals a disc herniation at C3/4 resulting in spinal cord compression  Patient has failed multiple forms of conservative care and continues to have pain (see office notes for additional details regarding the patient's full course of treatment)  Past Medical History:  Diagnosis Date   Anosmia    ENT thought likely related to chronic small vessel ischemic changes (09/21/21)   Arthritis    CVA (cerebral vascular accident) ([place])    right occipital lobe infarct 05/06/17 MRI   Dry cough 02/06/2014   Exertional dyspnea    GERD (gastroesophageal reflux disease)    Hypertension    Multinodular thyroid    Prediabetes    Prostate cancer ([place])    s/p EBRT 05/2015   Sleep apnea    02/07/22 HST: mild OSA w/ AHI 8, intolerant to CPAP (05/02/22)   Past Surgical History:  Procedure Laterality Date   BACK SURGERY  09/18/2009   lumbar   FRACTURE SURGERY Right    ORIF ankle   LUMBAR LAMINECTOMY/DECOMPRESSION MICRODISCECTOMY Right 02/11/2014   Procedure: MICRO LUMBAR DECOMPRESSION, MICRODISCECTOMY,  LATERAL MASS FUSION WITH AUTOGRAFT  LUMBAR FIVE TO SACRAL ONE  ON THE RIGHT, REDO MICRODISCECTOMY LUMBAR FOUR TO LUMBAR FIVE;  Surgeon: Johnn Hai, MD;  Location: [facility];  Service: Orthopedics;  Laterality: Right;   PARATHYROIDECTOMY     Social History   Socioeconomic History   Marital status: Married    Spouse name: Not on file   Number of children: Not on file   Years of education: Not on file   Highest education level: Not on file  Occupational History   Not on file  Tobacco Use   Smoking status: Former    Packs/day: 2.00    Years: 20.00    Total pack years: 40.00    Types: Cigarettes    Quit date: 02/07/2004    Years since quitting: 18.2   Smokeless tobacco: Never  Substance and Sexual Activity   Alcohol use:  Not Currently    Comment: none in the last year   Drug use: Not Currently    Types: Marijuana    Comment: none in the last year marijuana   Sexual activity: Not on file  Other Topics Concern   Not on file  Social History Narrative   Not on file   Social Determinants of Health   Financial Resource Strain: Not on file  Food Insecurity: Not on file  Transportation Needs: Not on file  Physical Activity: Not on file  Stress: Not on file  Social Connections: Not on file   No family history on file. Allergies  Allergen Reactions   Crestor [Rosuvastatin]     Made pt feel uncomfortable    Prior to Admission medications   Medication Sig Start Date End Date Taking? Authorizing Provider  amLODipine (NORVASC) 10 MG tablet Take 10 mg by mouth daily.   Yes [provider]  aspirin EC 81 MG tablet Take 1 tablet (81 mg total) by mouth daily. Resume 4 days post-op 02/12/14  Yes Lacie Draft M, PA-C  Cholecalciferol (VITAMIN D) 50 MCG (2000 UT) CAPS Take 2,000 Units by mouth daily.   Yes [provider]  Coenzyme Q10 (COQ10) 100 MG CAPS Take 100 mg by mouth daily.   Yes [provider]  lisinopril (ZESTRIL) 5  MG tablet Take 5 mg by mouth daily.   Yes [provider]  magnesium oxide (MAG-OX) 400 (240 Mg) MG tablet Take 400 mg by mouth 2 (two) times daily.   Yes [provider]  omeprazole (PRILOSEC) 20 MG capsule Take 20 mg by mouth daily.   Yes [provider]  pramipexole (MIRAPEX) 0.125 MG tablet Take 0.125 mg by mouth at bedtime. 04/16/22  Yes [provider]  pravastatin (PRAVACHOL) 40 MG tablet Take 40 mg by mouth every other day.   Yes [provider]  tadalafil (CIALIS) 5 MG tablet Take 5 mg by mouth every other day.   Yes [provider]  tamsulosin (FLOMAX) 0.4 MG CAPS capsule Take 0.4 mg by mouth daily.   Yes [provider]  vitamin E 180 MG (400 UNITS) capsule Take 400 Units by mouth daily.    Yes [provider]     All other systems have been reviewed and were otherwise negative with the exception of those mentioned in the HPI and as above.  Physical Exam: There were no vitals filed for this visit.  There is no height or weight on file to calculate BMI.  General: Alert, no acute distress Cardiovascular: No pedal edema Respiratory: No cyanosis, no use of accessory musculature Skin: No lesions in the area of chief complaint Neurologic: Sensation intact distally Psychiatric: Patient is competent for consent with normal mood and affect Lymphatic: No axillary or cervical lymphadenopathy   Assessment/Plan: Cervical myelopathy with a large C3-C4 disc herniation, resulting in severe spinal cord compression Plan for Procedure(s): ANTERIOR CERVICAL DECOMPRESSION FUSION CERVICAL 3- CERVICAL 4 WITH INSTRUMENTATION AND ALLOGRAFT   Norva Karvonen, MD 05/04/2022 6:51 AM

## 2022-05-04 NOTE — Op Note (Signed)
PATIENT NAME: Noah Jensen   MEDICAL RECORD NO.:   [id number]    DATE OF BIRTH: 02-20-1951   DATE OF PROCEDURE: 05/04/2022                               OPERATIVE REPORT     PREOPERATIVE DIAGNOSES: 1. Cervical myelopathy 2. Spinal stenosis spanning C3/4   POSTOPERATIVE DIAGNOSES: 1. Cervical myelopathy 2. Spinal stenosis spanning C3/4   PROCEDURE: 1. Anterior cervical decompression and fusion C3/4 2. Placement of anterior instrumentation, C3/4 3. Insertion of interbody device x1 (49m Titan intervertebral spacer). 4. Intraoperative use of fluoroscopy. 5. Use of morselized allograft - ViviGen.   SURGEON:  MPhylliss Bob MD   ASSISTANT:  KPricilla Holm PA-C.   ANESTHESIA:  General endotracheal anesthesia.   COMPLICATIONS:  None.   DISPOSITION:  Stable.   ESTIMATED BLOOD LOSS:  Minimal.   INDICATIONS FOR SURGERY:  Briefly, Noah Jensen a pleasant 71-year- old patient, who did present to me with progressive cervical myelopathy.   The patient's MRI did reveal the findings noted above, including spinal cord compression at C3-4.  Given his progressive symptoms and cord compression, we did discuss proceeding with the procedure noted above.  The patient was fully aware of the risks and limitations of surgery as outlined in my preoperative note.   OPERATIVE DETAILS:  On 05/04/2022, the patient was brought to surgery and general endotracheal anesthesia was administered.  The patient was placed supine on the hospital bed. The neck was gently extended.  All bony prominences were meticulously padded.  The neck was prepped and draped in the usual sterile fashion.  At this point, I did make a left-sided transverse incision.  The platysma was incised.  A Smith-Robinson approach was used and the anterior spine was identified. A self-retaining retractor was placed.  I then subperiosteally exposed the vertebral bodies from C3/4.  Caspar pins were then placed into the C3 and C4  vertebral bodies and distraction was applied.  A thorough and complete C3-4 intervertebral diskectomy was performed.  The posterior longitudinal ligament was identified and entered using a nerve hook.  I then used #1 followed by #2 Kerrison to perform a thorough and complete intervertebral diskectomy.  The spinal canal was thoroughly decompressed.  Specifically, there were multiple prominent disc fragments located within the spinal canal.  They were meticulously removed in their entirety, thereby entirely decompressing the spinal cord.  The endplates were then prepared and the appropriate-sized intervertebral spacer was then packed with ViviGen and tamped into position in the usual fashion. The Caspar pins  then were removed and bone wax was placed in their place.  The appropriate-sized anterior cervical plate was placed over the anterior spine.  14 mm variable angle screws were placed, 2 in each vertebral body from C3-C4 for a total of 4 vertebral body screws.  The screws were then locked to the plate using the Cam locking mechanism.  I was very pleased with the final fluoroscopic images.  The wound was then irrigated.  The wound was then explored for any undue bleeding and there was no bleeding noted. The wound was then closed in layers using 2-0 Vicryl, followed by 4-0 Monocryl.  Benzoin and Steri-Strips were applied, followed by sterile dressing.  All instrument counts were correct at the termination of the procedure.   Of note, KPricilla Holm PA-C, was my assistant throughout surgery, and did aid in retraction, placement of  the hardware, suctioning, and closure from start to finish.       Phylliss Bob, MD

## 2022-05-04 NOTE — Transfer of Care (Signed)
Immediate Anesthesia Transfer of Care Note  Patient: Noah Jensen  Procedure(s) Performed: ANTERIOR CERVICAL DECOMPRESSION FUSION CERVICAL 3- CERVICAL 4 WITH INSTRUMENTATION AND ALLOGRAFT (Spine Cervical)  Patient Location: PACU  Anesthesia Type:General  Level of Consciousness: awake  Airway & Oxygen Therapy: Patient Spontanous Breathing and Patient connected to nasal cannula oxygen  Post-op Assessment: Report given to RN and Post -op Vital signs reviewed and stable  Post vital signs: Reviewed and stable  Last Vitals:  Vitals Value Taken Time  BP 129/67 05/04/22 1553  Temp 37 C 05/04/22 1553  Pulse 73 05/04/22 1557  Resp 20 05/04/22 1557  SpO2 88 % 05/04/22 1557  Vitals shown include unvalidated device data.  Last Pain:  Vitals:   05/04/22 1134  TempSrc: Oral  PainSc:       Patients Stated Pain Goal: 2 (12/81/18 8677)  Complications: No notable events documented.

## 2022-05-04 NOTE — Anesthesia Procedure Notes (Signed)
Procedure Name: Intubation Date/Time: 05/04/2022 2:15 PM  Performed by: Minerva Ends, CRNAPre-anesthesia Checklist: Patient identified, Emergency Drugs available, Suction available and Patient being monitored Patient Re-evaluated:Patient Re-evaluated prior to induction Oxygen Delivery Method: Circle system utilized Preoxygenation: Pre-oxygenation with 100% oxygen Induction Type: IV induction Ventilation: Mask ventilation without difficulty Laryngoscope Size: Mac, Glidescope and 3 Grade View: Grade I Tube type: Oral Tube size: 7.0 mm Number of attempts: 1 Airway Equipment and Method: Stylet and Oral airway Placement Confirmation: ETT inserted through vocal cords under direct vision, positive ETCO2 and breath sounds checked- equal and bilateral Secured at: 23 cm Tube secured with: Tape Dental Injury: Teeth and Oropharynx as per pre-operative assessment

## 2022-05-08 ENCOUNTER — Encounter (HOSPITAL_COMMUNITY): Payer: Self-pay | Admitting: Orthopedic Surgery

## 2022-05-08 NOTE — Anesthesia Postprocedure Evaluation (Signed)
Anesthesia Post Note  Patient: Noah Jensen  Procedure(s) Performed: ANTERIOR CERVICAL DECOMPRESSION FUSION CERVICAL 3- CERVICAL 4 WITH INSTRUMENTATION AND ALLOGRAFT (Spine Cervical)     Patient location during evaluation: PACU Anesthesia Type: General Level of consciousness: awake and alert Pain management: pain level controlled Vital Signs Assessment: post-procedure vital signs reviewed and stable Respiratory status: spontaneous breathing, nonlabored ventilation, respiratory function stable and patient connected to nasal cannula oxygen Cardiovascular status: blood pressure returned to baseline and stable Postop Assessment: no apparent nausea or vomiting Anesthetic complications: no   No notable events documented.  Last Vitals:  Vitals:   05/04/22 1645 05/04/22 1700  BP: 116/72 (!) 117/106  Pulse: 73 73  Resp: 13 16  Temp:  37 C  SpO2: 99% 98%    Last Pain:  Vitals:   05/04/22 1700  TempSrc:   PainSc: 2    Pain Goal: Patients Stated Pain Goal: 2 (05/04/22 1700)                 Tiajuana Amass

## 2022-09-01 ENCOUNTER — Other Ambulatory Visit: Payer: Self-pay | Admitting: Orthopedic Surgery

## 2022-09-05 ENCOUNTER — Encounter (HOSPITAL_COMMUNITY): Payer: Self-pay

## 2022-09-05 NOTE — Pre-Procedure Instructions (Addendum)
Surgical Instructions    Your procedure is scheduled on September 21, 2021.  Report to [facility] Main Entrance "A" at 05:30 A.M., then check in with the Admitting office.  Call this number if you have problems the morning of surgery:  [phone]   If you have any questions prior to your surgery date call [phone]: Open Monday-Friday 8am-4pm If you experience any cold or flu symptoms such as cough, fever, chills, shortness of breath, etc. between now and your scheduled surgery, please notify us at the above number     Remember:  Do not eat after midnight the night before your surgery  You may drink clear liquids until 04:30am the morning of your surgery.   Clear liquids allowed are: Water, Non-Citrus Juices (without pulp), Carbonated Beverages, Clear Tea, Black Coffee ONLY (NO MILK, CREAM OR POWDERED CREAMER of any kind), and Gatorade.  Enhanced Recovery after Surgery for Orthopedics Enhanced Recovery after Surgery is a protocol used to improve the stress on your body and your recovery after surgery.  Patient Instructions  The day of surgery (if you have diabetes):  Drink ONE small 10 oz bottle of water OR G2 Gatorade by 4:30am the morning of surgery This bottle was given to you during your hospital  pre-op appointment visit.  Nothing else to drink after completing the  Small 10 oz bottle of water.         If you have questions, please contact your surgeon's office.     Take these medicines the morning of surgery with A SIP OF WATER:  Amlodipine (Norvasc) Omeprazole (Prilosec) Pravastatin (Pravachol)  If needed: Hydrocodone-acetaminophen (Norco) Eye drops   As of today, STOP taking any Aspirin (unless otherwise instructed by your surgeon) Aleve, Naproxen, Ibuprofen, Motrin, Advil, Goody's, BC's, all herbal medications, fish oil, and all vitamins.           Do not wear jewelry . Do not wear lotions, powders, cologne or deodorant. Do not shave 48 hours prior to  surgery.  Men may shave face and neck. Do not bring valuables to the hospital.  [facility] is not responsible for any belongings or valuables.    Do NOT Smoke (Tobacco/Vaping)  24 hours prior to your procedure  If you use a CPAP at night, you may bring your mask for your overnight stay.   Contacts, glasses, hearing aids, dentures or partials may not be worn into surgery, please bring cases for these belongings   For patients admitted to the hospital, discharge time will be determined by your treatment team.   Patients discharged the day of surgery will not be allowed to drive home, and someone needs to stay with them for 24 hours.   SURGICAL WAITING ROOM VISITATION Patients having surgery or a procedure may have no more than 2 support people in the waiting area - these visitors may rotate.   Children under the age of 76 must have an adult with them who is not the patient. If the patient needs to stay at the hospital during part of their recovery, the visitor guidelines for inpatient rooms apply. Pre-op nurse will coordinate an appropriate time for 1 support person to accompany patient in pre-op.  This support person may not rotate.   Please refer to [email or web address] for the visitor guidelines for Inpatients (after your surgery is over and you are in a regular room).    Special instructions:    Oral Hygiene is also important to reduce your risk of infection.  Remember -  BRUSH YOUR TEETH THE MORNING OF SURGERY WITH YOUR REGULAR TOOTHPASTE   [place]- Preparing For Surgery  Before surgery, you can play an important role. Because skin is not sterile, your skin needs to be as free of germs as possible. You can reduce the number of germs on your skin by washing with CHG (chlorahexidine gluconate) Soap before surgery.  CHG is an antiseptic cleaner which kills germs and bonds with the skin to continue killing germs even after washing.      Please do not use if you have an allergy to CHG or antibacterial soaps. If your skin becomes reddened/irritated stop using the CHG.  Do not shave (including legs and underarms) for at least 48 hours prior to first CHG shower. It is OK to shave your face.  Please follow these instructions carefully.     Shower the NIGHT BEFORE SURGERY and the MORNING OF SURGERY with CHG Soap.   If you chose to wash your hair, wash your hair first as usual with your normal shampoo. After you shampoo, rinse your hair and body thoroughly to remove the shampoo.  Then [employer] and genitals (private parts) with your normal soap and rinse thoroughly to remove soap.  After that Use CHG Soap as you would any other liquid soap. You can apply CHG directly to the skin and wash gently with a scrungie or a clean washcloth.   Apply the CHG Soap to your body ONLY FROM THE NECK DOWN.  Do not use on open wounds or open sores. Avoid contact with your eyes, ears, mouth and genitals (private parts). Wash Face and genitals (private parts)  with your normal soap.   Wash thoroughly, paying special attention to the area where your surgery will be performed.  Thoroughly rinse your body with warm water from the neck down.  DO NOT shower/wash with your normal soap after using and rinsing off the CHG Soap.  Pat yourself dry with a CLEAN TOWEL.  Wear CLEAN PAJAMAS to bed the night before surgery  Place CLEAN SHEETS on your bed the night before your surgery  DO NOT SLEEP WITH PETS.   Day of Surgery:  Take a shower with CHG soap. Wear Clean/Comfortable clothing the morning of surgery Do not apply any deodorants/lotions.   Remember to brush your teeth WITH YOUR REGULAR TOOTHPASTE.    If you received a COVID test during your pre-op visit, it is requested that you wear a mask when out in public, stay away from anyone that may not be feeling well, and notify your surgeon if you develop symptoms. If you have been in contact  with anyone that has tested positive in the last 10 days, please notify your surgeon.    Please read over the following fact sheets that you were given.

## 2022-09-06 ENCOUNTER — Other Ambulatory Visit: Payer: Self-pay

## 2022-09-06 ENCOUNTER — Encounter (HOSPITAL_COMMUNITY)
Admission: RE | Admit: 2022-09-06 | Discharge: 2022-09-06 | Disposition: A | Discharge status: Home / Self Care | Payer: Medicare Other | Source: Ambulatory Visit | Attending: Orthopedic Surgery | Admitting: Orthopedic Surgery

## 2022-09-06 ENCOUNTER — Encounter (HOSPITAL_COMMUNITY): Payer: Self-pay

## 2022-09-06 VITALS — BP 143/75 | HR 72 | Temp 97.5°F | Resp 18 | Ht 69.0 in | Wt 226.5 lb

## 2022-09-06 DIAGNOSIS — M4316 Spondylolisthesis, lumbar region: Secondary | ICD-10-CM | POA: Diagnosis not present

## 2022-09-06 DIAGNOSIS — M48062 Spinal stenosis, lumbar region with neurogenic claudication: Secondary | ICD-10-CM | POA: Insufficient documentation

## 2022-09-06 DIAGNOSIS — M4807 Spinal stenosis, lumbosacral region: Secondary | ICD-10-CM | POA: Insufficient documentation

## 2022-09-06 DIAGNOSIS — K219 Gastro-esophageal reflux disease without esophagitis: Secondary | ICD-10-CM | POA: Diagnosis not present

## 2022-09-06 DIAGNOSIS — G4733 Obstructive sleep apnea (adult) (pediatric): Secondary | ICD-10-CM | POA: Diagnosis not present

## 2022-09-06 DIAGNOSIS — M5126 Other intervertebral disc displacement, lumbar region: Secondary | ICD-10-CM | POA: Insufficient documentation

## 2022-09-06 DIAGNOSIS — Z79899 Other long term (current) drug therapy: Secondary | ICD-10-CM | POA: Diagnosis not present

## 2022-09-06 DIAGNOSIS — M4317 Spondylolisthesis, lumbosacral region: Secondary | ICD-10-CM | POA: Diagnosis not present

## 2022-09-06 DIAGNOSIS — Z8673 Personal history of transient ischemic attack (TIA), and cerebral infarction without residual deficits: Secondary | ICD-10-CM | POA: Insufficient documentation

## 2022-09-06 DIAGNOSIS — E213 Hyperparathyroidism, unspecified: Secondary | ICD-10-CM | POA: Diagnosis not present

## 2022-09-06 DIAGNOSIS — Z01812 Encounter for preprocedural laboratory examination: Secondary | ICD-10-CM | POA: Diagnosis present

## 2022-09-06 DIAGNOSIS — Z8546 Personal history of malignant neoplasm of prostate: Secondary | ICD-10-CM | POA: Diagnosis not present

## 2022-09-06 DIAGNOSIS — E119 Type 2 diabetes mellitus without complications: Secondary | ICD-10-CM | POA: Insufficient documentation

## 2022-09-06 DIAGNOSIS — M47816 Spondylosis without myelopathy or radiculopathy, lumbar region: Secondary | ICD-10-CM | POA: Diagnosis not present

## 2022-09-06 DIAGNOSIS — Z01818 Encounter for other preprocedural examination: Secondary | ICD-10-CM

## 2022-09-06 LAB — CBC
HCT: 45.6 % (ref 39.0–52.0)
Hemoglobin: 15 g/dL (ref 13.0–17.0)
MCH: 29.9 pg (ref 26.0–34.0)
MCHC: 32.9 g/dL (ref 30.0–36.0)
MCV: 91 fL (ref 80.0–100.0)
Platelets: 193 10*3/uL (ref 150–400)
RBC: 5.01 MIL/uL (ref 4.22–5.81)
RDW: 13.8 % (ref 11.5–15.5)
WBC: 5.6 10*3/uL (ref 4.0–10.5)
nRBC: 0 % (ref 0.0–0.2)

## 2022-09-06 LAB — BASIC METABOLIC PANEL
Anion gap: 9 (ref 5–15)
BUN: 10 mg/dL (ref 8–23)
CO2: 27 mmol/L (ref 22–32)
Calcium: 9.9 mg/dL (ref 8.9–10.3)
Chloride: 105 mmol/L (ref 98–111)
Creatinine, Ser: 1.15 mg/dL (ref 0.61–1.24)
GFR, Estimated: >60 mL/min (ref >60)
Glucose, Bld: 101 mg/dL — ABNORMAL HIGH (ref 70–99)
Potassium: 4.4 mmol/L (ref 3.5–5.1)
Sodium: 141 mmol/L (ref 135–145)

## 2022-09-06 LAB — SURGICAL PCR SCREEN
MRSA, PCR: NEGATIVE
Staphylococcus aureus: NEGATIVE

## 2022-09-06 LAB — GLUCOSE, CAPILLARY: Glucose-Capillary: 113 mg/dL — ABNORMAL HIGH (ref 70–99)

## 2022-09-06 NOTE — Progress Notes (Addendum)
PCP - Max Irean Hong MD Cardiologist - Derald Macleod Beam MD  PPM/ICD - Denies Device Orders -  Rep Notified -   Chest x-ray - 02/06/14 EKG - 05/02/22 Stress Test - 06/09/21 ECHO - 12/21/20 Cardiac Cath - none  Sleep Study - 01/2022 CPAP - pt could not tolerate  Fasting Blood Sugar - pt states he rarely checks his blood sugar. Says he has "Prediabetes". A1C on 09/01/22 was 6.0. Pt does not take diabetes meds.    Last dose of GLP1 agonist-na   GLP1 instructions:   Blood Thinner Instructions:na Aspirin Instructions:pt states he was instructed by Dr. Laurena Bering nurse to stop aspirin seven days prior to surgery.   ERAS Protcol - clear liquids until 0430. PRE-SURGERY Ensure or G2- G2  COVID TEST- na   Anesthesia review- yes- Pt stated he is scheduled for an ultrasound of abdominal aorta tomorrow because of a prominent abdominal aortic pulse. Results requested via fax  Patient denies shortness of breath, fever, cough and chest pain at PAT appointment   All instructions explained to the patient, with a verbal understanding of the material. Patient agrees to go over the instructions while at home for a better understanding. Patient also instructed to self quarantine after being tested for COVID-19. The opportunity to ask questions was provided.

## 2022-09-07 NOTE — Progress Notes (Addendum)
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  He is status post 2 decompressive procedures at L4-L5 and L5-S1.  Location: [facility]  Surgeons: Phylliss Bob, MD    DISCUSSION: Patient is a 71 year old male scheduled for the above procedure on 09/21/2022 by Dr. Lynann Bologna. S/p C3-4 ACDF 05/04/22.   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     He had primary care CPE exam on 08/31/22 with Rudene Christians, NP. Surgery plans noted. Pulsatile abdominal aorta, so ultrasound ordered--it appears to be scheduled for 09/07/22 11:00 AM.   Last cardiology evaluation was on 12/06/21 with cardiology resident Beam, Nada Boozer, MD. History of atypical chest pain and chronic DOE. He had reassuring stress test and echocardiogram in 2022. (12/21/20 echo showed borderline concentric LVH, EF 55-60%, prolonged LV relaxation, no significant valvular disease. Normal perufsion 06/09/21 nuclear stress test.) He continued to exercise with weights and walking on the treadmill about 10 minutes daily. Some leg cramping with exercise, but improved. Advised to hydrate and continue to work on diet and exercise. Dyspnea felt likely  due to obesity and deconditioning. Lisinopril 5 mg added due to elevated BP and prediabetic status. One year or as needed for symptoms recommended.   PFTs in May 2023 showed "mild restrictive ventilatory impairment No obstructive disease noted". He had mild OSA, with majority of events while supine by May 2023 home sleep study. He is intolerant to CPAP, so Dr. Early Osmond discussed use of a mandibular advancement device as well as positional therapy. He does have known multi-nodular thyroid, but denied compressive symptoms--denied neck fullness or dysphagia.    A1c 6.0% on 08/31/22 ([facility]). He reported instructions to hold ASA 7 days prior to surgery. Awaiting abdominal US results.   ADDENDUM 09/14/22 11:29 AM:  09/14/22 [place] did not show evidence of a AAA. Anesthesia team to evaluate on the day of surgery.    VS: BP (!) 143/75   Pulse 72   Temp (!) 36.4 C (Oral)   Resp 18   Ht '5\' 9"'$  (1.753 m)   Wt 102.7 kg   SpO2 98%   BMI 33.45 kg/m    PROVIDERS: Wendy Poet, PA is PCP  Howell Rucks, MD is cardiologist ([facility]) Baruch Gouty, MD is urologist ([facility]) Tommas Olp, MD is ENT ([facility]). Per 08/09/21 note, patient deferred thyroid nodule FNA and preferred serial [place], so one year follow-up planned. Calcium levels stable since parathyroidectomy.  Plonk, Dian Situ, MD is ENT/Rhinologist ([facility]). See on 09/21/21 for anosmia, ageusia. Symptoms attributed to small vessel ischemic changes. Jiles Prows, MD is a pulmonologist for sleep medicine ([facility])   LABS: Labs reviewed: Acceptable for surgery. A1c 6.0% ([facility]) on 08/31/22. LFTs normal on 08/29/22 CMP ([facility]). (all labs ordered are listed, but only abnormal results are displayed)  Labs Reviewed  GLUCOSE, CAPILLARY - Abnormal; Notable for the following components:  Result Value   Glucose-Capillary 113 (*)    All other components within normal limits  BASIC METABOLIC PANEL - Abnormal; Notable for the following components:    Glucose, Bld 101 (*)    All other components within normal limits  SURGICAL PCR SCREEN  CBC    OTHER: Per 05/02/22 [facility] PCP note: Home Sleep Study 02/09/22: "Home PSG 02/09/2022 showed mild OSA with AHI of 8  ecommended AutoPap if positive."   PFTs 02/08/22: "PFTs 02/08/2022 showed a mild restrictive ventilatory impairment No obstructive disease noted."     IMAGES: [place] Abd 09/04/22 ([facility]): IMPRESSION: 1.  Increased echogenicity of the liver which can be seen in hepatic steatosis.  2.  Visualized aorta tapers normally without evidence of aneurysm.   MRI L-spine 03/12/22: IMPRESSION: 1. Degenerative lumbar spondylosis with multilevel disc disease and facet disease. 2. Multilevel multifactorial spinal, lateral recess and foraminal stenosis as discussed above at the individual levels. The most significant levels are L3-4 and L4-5. 3. Broad-based left foraminal and extraforaminal disc protrusion at L4-5 contacting and displacing the left L4 nerve root. 4. Moderate bilateral foraminal stenosis at L5-S1, left greater than right.   US Thyroid 08/09/21 ([facility]): Impression: Enlarged multinodular thyroid with a TI RADS 4 lesion in the mid left lobe that based on size criteria merits further evaluation with fine needle aspiration. A second left-sided lesion requires follow-up in 12 months.   - Patient opted for serial [place] in 1 year instead of FNA.     EKG: EKG 05/02/22:  Normal sinus rhythm Normal ECG When compared with ECG of 06-Feb-2014 08:23, PREVIOUS ECG IS PRESENT Confirmed by Dixie Dials ([id number]) on 05/03/2022 10:13:17 PM   EKG 11/23/20 ([facility]): Per Narrative in [facility]: Sinus rhythm with sinus arrhythmia  Right bundle branch block  When compared with ECG of 20-Oct-2020 17:44,  No significant change was found  Confirmed by fellow Marzetta Board [phone]) on 11/24/2020 11:07:42 AM  Confirmed by [place] [id number]) on 11/24/2020 8:35:08 PM      CV: Nuclear  stress test 06/09/21 ([facility]): Conclusions: 1.) LIMITATIONS: None.  2.) MYOCARDIAL PERFUSION: Normal.  3.) LEFT VENTRICULAR EJECTION FRACTION: Normal.  4.) REGIONAL WALL MOTION: Normal.   Echo 12/21/20 ([facility]): SUMMARY  The left ventricular size is normal.  There is borderline concentric left ventricular hypertrophy.  LV ejection fraction = 55-60%.  The left ventricular wall motion is normal.  Left ventricular filling pattern is prolonged relaxation.  The right ventricle is normal in size and function.  There is no significant valvular stenosis or regurgitation.  The IVC is normal in size with an inspiratory collapse of greater then  50%, suggesting normal right atrial pressure.  There is no pericardial effusion.  There is no comparison study available.    Past Medical History:  Diagnosis Date   Anosmia    ENT thought likely related to chronic small vessel ischemic changes (09/21/21)   Arthritis    CVA (cerebral vascular accident) ([place])    right occipital lobe infarct 05/06/17 MRI   Diabetes mellitus without complication ([place])    Dry cough 02/06/2014   Exertional dyspnea    GERD (gastroesophageal reflux disease)    Hypertension    Multinodular thyroid    Prediabetes    Prostate cancer ([place])    s/p EBRT 05/2015   Sleep apnea    02/07/22 HST: mild OSA w/ AHI 8, intolerant to CPAP (05/02/22)    Past Surgical History:  Procedure Laterality Date  ANTERIOR CERVICAL DECOMP/DISCECTOMY FUSION N/A 05/04/2022   Procedure: ANTERIOR CERVICAL DECOMPRESSION FUSION CERVICAL 3- CERVICAL 4 WITH INSTRUMENTATION AND ALLOGRAFT;  Surgeon: Phylliss Bob, MD;  Location: [place];  Service: Orthopedics;  Laterality: N/A;   BACK SURGERY  09/18/2009   lumbar   FRACTURE SURGERY Right    ORIF ankle   JOINT REPLACEMENT Right 2018   LUMBAR LAMINECTOMY/DECOMPRESSION MICRODISCECTOMY Right 02/11/2014   Procedure: MICRO LUMBAR DECOMPRESSION, MICRODISCECTOMY,  LATERAL MASS FUSION WITH AUTOGRAFT  LUMBAR  FIVE TO SACRAL ONE  ON THE RIGHT, REDO MICRODISCECTOMY LUMBAR FOUR TO LUMBAR FIVE;  Surgeon: Johnn Hai, MD;  Location: [facility];  Service: Orthopedics;  Laterality: Right;   PARATHYROIDECTOMY      MEDICATIONS:  amLODipine (NORVASC) 10 MG tablet   aspirin EC 81 MG tablet   Cholecalciferol (VITAMIN D) 50 MCG (2000 UT) CAPS   Coenzyme Q10 (COQ10) 100 MG CAPS   HYDROcodone-acetaminophen (NORCO/VICODIN) 5-325 MG tablet   lisinopril (ZESTRIL) 5 MG tablet   magnesium oxide (MAG-OX) 400 (240 Mg) MG tablet   methocarbamol (ROBAXIN) 750 MG tablet   omeprazole (PRILOSEC) 20 MG capsule   pravastatin (PRAVACHOL) 40 MG tablet   tadalafil (CIALIS) 5 MG tablet   tamsulosin (FLOMAX) 0.4 MG CAPS capsule   tetrahydrozoline-zinc (VISINE-AC) 0.05-0.25 % ophthalmic solution   vitamin E 180 MG (400 UNITS) capsule   No current facility-administered medications for this encounter.    Myra Gianotti, PA-C Surgical Short Stay/Anesthesiology [facility] Phone [phone] [facility] Phone [phone] 09/07/2022 3:02 PM

## 2022-09-14 NOTE — Anesthesia Preprocedure Evaluation (Addendum)
Anesthesia Evaluation  Patient identified by MRN, date of birth, ID band Patient awake    Reviewed: Allergy & Precautions, NPO status , Patient's Chart, lab work & pertinent test results  History of Anesthesia Complications Negative for: history of anesthetic complications  Airway Mallampati: III  TM Distance: >3 FB Neck ROM: Full   Comment: Previous grade I view with glidescope 3, easy mask ventilation Dental  (+) Partial Upper, Lower Dentures   Pulmonary neg shortness of breath, sleep apnea (does not use CPAP) , neg COPD, Recent URI , Residual Cough, former smoker   Pulmonary exam normal breath sounds clear to auscultation       Cardiovascular hypertension (amlodipine, lisinopril), Pt. on medications (-) angina (-) Past MI, (-) Cardiac Stents and (-) CABG (-) dysrhythmias  Rhythm:Regular Rate:Normal  HLD  Nuclear stress test 06/09/2021 CONCLUSION: 1.) LIMITATIONS: None. 2.) MYOCARDIAL PERFUSION: Normal. 3.) LEFT VENTRICULAR EJECTION FRACTION: Normal. 4.) REGIONAL WALL MOTION: Normal.  TTE 12/21/2020 SUMMARY The left ventricular size is normal. There is borderline concentric left ventricular hypertrophy. LV ejection fraction = 55-60%. The left ventricular wall motion is normal. Left ventricular filling pattern is prolonged relaxation. The right ventricle is normal in size and function. There is no significant valvular stenosis or regurgitation. The IVC is normal in size with an inspiratory collapse of greater then 50%, suggesting normal right atrial pressure. There is no pericardial effusion. There is no comparison study available.     Neuro/Psych neg Seizures  Neuromuscular disease (lumbar spinal stenosis) CVA (right occipital lobe 05/06/2017)    GI/Hepatic Neg liver ROS,GERD  Medicated,,  Endo/Other  diabetes  Multinodular thyroid, pre-diabetes  Renal/GU negative Renal ROS     Musculoskeletal  (+) Arthritis ,     Abdominal  (+) + obese  Peds  Hematology negative hematology ROS (+)   Anesthesia Other Findings H/o prostate cancer s/p EBRT 05/2015  Reproductive/Obstetrics                             Anesthesia Physical Anesthesia Plan  ASA: 3  Anesthesia Plan: General   Post-op Pain Management: Tylenol PO (pre-op)*   Induction: Intravenous  PONV Risk Score and Plan: 2 and Ondansetron, Dexamethasone and Treatment may vary due to age or medical condition  Airway Management Planned: Oral ETT and Video Laryngoscope Planned  Additional Equipment:   Intra-op Plan:   Post-operative Plan: Extubation in OR  Informed Consent: I have reviewed the patients History and Physical, chart, labs and discussed the procedure including the risks, benefits and alternatives for the proposed anesthesia with the patient or authorized representative who has indicated his/her understanding and acceptance.     Dental advisory given  Plan Discussed with: CRNA and Anesthesiologist  Anesthesia Plan Comments: (PAT note written by Myra Gianotti, PA-C.  Risks of general anesthesia discussed including, but not limited to, sore throat, hoarse voice, chipped/damaged teeth, injury to vocal cords, nausea and vomiting, allergic reactions, lung infection, heart attack, stroke, and death. All questions answered.   )       Anesthesia Quick Evaluation

## 2022-09-21 ENCOUNTER — Encounter (HOSPITAL_COMMUNITY)
Admission: RE | Disposition: A | Discharge status: Home / Self Care | Payer: Self-pay | Source: Home / Self Care | Attending: Orthopedic Surgery

## 2022-09-21 ENCOUNTER — Other Ambulatory Visit: Payer: Self-pay

## 2022-09-21 ENCOUNTER — Ambulatory Visit (HOSPITAL_COMMUNITY): Payer: Medicare Other

## 2022-09-21 ENCOUNTER — Ambulatory Visit (HOSPITAL_BASED_OUTPATIENT_CLINIC_OR_DEPARTMENT_OTHER): Payer: Medicare Other | Admitting: Vascular Surgery

## 2022-09-21 ENCOUNTER — Encounter (HOSPITAL_COMMUNITY): Payer: Self-pay | Admitting: Orthopedic Surgery

## 2022-09-21 ENCOUNTER — Observation Stay (HOSPITAL_COMMUNITY)
Admission: RE | Admit: 2022-09-21 | Discharge: 2022-09-23 | Disposition: A | Discharge status: Home / Self Care | Payer: Medicare Other | Attending: Orthopedic Surgery | Admitting: Orthopedic Surgery

## 2022-09-21 ENCOUNTER — Ambulatory Visit (HOSPITAL_COMMUNITY): Payer: Medicare Other | Admitting: Vascular Surgery

## 2022-09-21 DIAGNOSIS — M48062 Spinal stenosis, lumbar region with neurogenic claudication: Secondary | ICD-10-CM | POA: Insufficient documentation

## 2022-09-21 DIAGNOSIS — M4316 Spondylolisthesis, lumbar region: Secondary | ICD-10-CM | POA: Insufficient documentation

## 2022-09-21 DIAGNOSIS — Z8546 Personal history of malignant neoplasm of prostate: Secondary | ICD-10-CM | POA: Diagnosis not present

## 2022-09-21 DIAGNOSIS — Z8673 Personal history of transient ischemic attack (TIA), and cerebral infarction without residual deficits: Secondary | ICD-10-CM | POA: Diagnosis not present

## 2022-09-21 DIAGNOSIS — Z7982 Long term (current) use of aspirin: Secondary | ICD-10-CM | POA: Insufficient documentation

## 2022-09-21 DIAGNOSIS — Z87891 Personal history of nicotine dependence: Secondary | ICD-10-CM | POA: Diagnosis not present

## 2022-09-21 DIAGNOSIS — E119 Type 2 diabetes mellitus without complications: Secondary | ICD-10-CM | POA: Diagnosis not present

## 2022-09-21 DIAGNOSIS — Z969 Presence of functional implant, unspecified: Secondary | ICD-10-CM | POA: Insufficient documentation

## 2022-09-21 DIAGNOSIS — Z79899 Other long term (current) drug therapy: Secondary | ICD-10-CM | POA: Insufficient documentation

## 2022-09-21 DIAGNOSIS — M48061 Spinal stenosis, lumbar region without neurogenic claudication: Secondary | ICD-10-CM

## 2022-09-21 DIAGNOSIS — M5416 Radiculopathy, lumbar region: Secondary | ICD-10-CM | POA: Diagnosis not present

## 2022-09-21 DIAGNOSIS — I10 Essential (primary) hypertension: Secondary | ICD-10-CM | POA: Insufficient documentation

## 2022-09-21 HISTORY — PX: TRANSFORAMINAL LUMBAR INTERBODY FUSION (TLIF) WITH PEDICLE SCREW FIXATION 4 LEVEL: SHX6144

## 2022-09-21 LAB — GLUCOSE, CAPILLARY
Glucose-Capillary: 105 mg/dL — ABNORMAL HIGH (ref 70–99)
Glucose-Capillary: 174 mg/dL — ABNORMAL HIGH (ref 70–99)
Glucose-Capillary: 209 mg/dL — ABNORMAL HIGH (ref 70–99)

## 2022-09-21 LAB — TYPE AND SCREEN
ABO/RH(D): B POS
Antibody Screen: NEGATIVE

## 2022-09-21 SURGERY — TRANSFORAMINAL LUMBAR INTERBODY FUSION (TLIF) WITH PEDICLE SCREW FIXATION 4 LEVEL
Anesthesia: General | Site: Spine Lumbar | Laterality: Left

## 2022-09-21 MED ORDER — VITAMIN D3 25 MCG (1000 UNIT) PO TABS
2000.0000 [IU] | ORAL_TABLET | Freq: Every day | ORAL | Status: DC
  Administered 2022-09-22 – 2022-09-23 (×2): 2000 [IU] via ORAL
  Filled 2022-09-21 (×4): qty 2

## 2022-09-21 MED ORDER — METHOCARBAMOL 1000 MG/10ML IJ SOLN: 500.0000 mg | Freq: Four times a day (QID) | INTRAVENOUS | Status: DC | PRN

## 2022-09-21 MED ORDER — MIDAZOLAM HCL 2 MG/2ML IJ SOLN
INTRAMUSCULAR | Status: AC
  Filled 2022-09-21: qty 2

## 2022-09-21 MED ORDER — SUGAMMADEX SODIUM 200 MG/2ML IV SOLN
INTRAVENOUS | Status: DC | PRN
  Administered 2022-09-21: 205 mg via INTRAVENOUS

## 2022-09-21 MED ORDER — HYDROMORPHONE HCL 1 MG/ML IJ SOLN
INTRAMUSCULAR | Status: AC
  Filled 2022-09-21: qty 1

## 2022-09-21 MED ORDER — AMLODIPINE BESYLATE 10 MG PO TABS
10.0000 mg | ORAL_TABLET | Freq: Every day | ORAL | Status: DC
  Administered 2022-09-22 – 2022-09-23 (×2): 10 mg via ORAL
  Filled 2022-09-21 (×2): qty 1

## 2022-09-21 MED ORDER — BISACODYL 5 MG PO TBEC: 5.0000 mg | DELAYED_RELEASE_TABLET | Freq: Every day | ORAL | Status: DC | PRN

## 2022-09-21 MED ORDER — PRAVASTATIN SODIUM 40 MG PO TABS
40.0000 mg | ORAL_TABLET | ORAL | Status: DC
  Administered 2022-09-23: 40 mg via ORAL
  Filled 2022-09-21: qty 1

## 2022-09-21 MED ORDER — CHLORHEXIDINE GLUCONATE 0.12 % MT SOLN
15.0000 mL | Freq: Once | OROMUCOSAL | Status: AC
  Administered 2022-09-21: 15 mL via OROMUCOSAL
  Filled 2022-09-21: qty 15

## 2022-09-21 MED ORDER — STERILE WATER FOR IRRIGATION IR SOLN
Status: DC | PRN
  Administered 2022-09-21: 1000 mL

## 2022-09-21 MED ORDER — ROCURONIUM BROMIDE 10 MG/ML (PF) SYRINGE
PREFILLED_SYRINGE | INTRAVENOUS | Status: DC | PRN
  Administered 2022-09-21: 10 mg via INTRAVENOUS
  Administered 2022-09-21 (×2): 20 mg via INTRAVENOUS
  Administered 2022-09-21: 30 mg via INTRAVENOUS
  Administered 2022-09-21: 50 mg via INTRAVENOUS
  Administered 2022-09-21: 10 mg via INTRAVENOUS
  Administered 2022-09-21: 50 mg via INTRAVENOUS
  Administered 2022-09-21: 10 mg via INTRAVENOUS

## 2022-09-21 MED ORDER — CEFAZOLIN SODIUM-DEXTROSE 2-4 GM/100ML-% IV SOLN
2.0000 g | Freq: Three times a day (TID) | INTRAVENOUS | Status: AC
  Administered 2022-09-21 – 2022-09-22 (×2): 2 g via INTRAVENOUS
  Filled 2022-09-21 (×2): qty 100

## 2022-09-21 MED ORDER — PHENOL 1.4 % MT LIQD: 1.0000 | OROMUCOSAL | Status: DC | PRN

## 2022-09-21 MED ORDER — PROPOFOL 10 MG/ML IV BOLUS
INTRAVENOUS | Status: DC | PRN
  Administered 2022-09-21: 150 mg via INTRAVENOUS
  Administered 2022-09-21: 50 mg via INTRAVENOUS

## 2022-09-21 MED ORDER — HYDROCODONE-ACETAMINOPHEN 5-325 MG PO TABS: 1.0000 | ORAL_TABLET | ORAL | Status: DC | PRN

## 2022-09-21 MED ORDER — NAPHAZOLINE-GLYCERIN 0.012-0.25 % OP SOLN: 1.0000 [drp] | Freq: Four times a day (QID) | OPHTHALMIC | Status: DC | PRN

## 2022-09-21 MED ORDER — METHOCARBAMOL 500 MG PO TABS
500.0000 mg | ORAL_TABLET | Freq: Four times a day (QID) | ORAL | Status: DC | PRN
  Administered 2022-09-22 – 2022-09-23 (×3): 500 mg via ORAL
  Filled 2022-09-21 (×3): qty 1

## 2022-09-21 MED ORDER — MENTHOL 3 MG MT LOZG: 1.0000 | LOZENGE | OROMUCOSAL | Status: DC | PRN

## 2022-09-21 MED ORDER — 0.9 % SODIUM CHLORIDE (POUR BTL) OPTIME
TOPICAL | Status: DC | PRN
  Administered 2022-09-21 (×4): 1000 mL

## 2022-09-21 MED ORDER — TAMSULOSIN HCL 0.4 MG PO CAPS
0.4000 mg | ORAL_CAPSULE | Freq: Every day | ORAL | Status: DC
  Administered 2022-09-22 – 2022-09-23 (×2): 0.4 mg via ORAL
  Filled 2022-09-21 (×2): qty 1

## 2022-09-21 MED ORDER — ROCURONIUM BROMIDE 10 MG/ML (PF) SYRINGE
PREFILLED_SYRINGE | INTRAVENOUS | Status: AC
  Filled 2022-09-21: qty 10

## 2022-09-21 MED ORDER — ACETAMINOPHEN 325 MG PO TABS: 650.0000 mg | ORAL_TABLET | ORAL | Status: DC | PRN

## 2022-09-21 MED ORDER — CEFAZOLIN SODIUM 1 G IJ SOLR
INTRAMUSCULAR | Status: AC
  Filled 2022-09-21: qty 20

## 2022-09-21 MED ORDER — LACTATED RINGERS IV SOLN: INTRAVENOUS | Status: DC

## 2022-09-21 MED ORDER — MAGNESIUM OXIDE -MG SUPPLEMENT 400 (240 MG) MG PO TABS
400.0000 mg | ORAL_TABLET | Freq: Every day | ORAL | Status: DC
  Administered 2022-09-21 – 2022-09-23 (×3): 400 mg via ORAL
  Filled 2022-09-21 (×3): qty 1

## 2022-09-21 MED ORDER — EPINEPHRINE PF 1 MG/ML IJ SOLN
INTRAMUSCULAR | Status: AC
  Filled 2022-09-21: qty 1

## 2022-09-21 MED ORDER — SENNOSIDES-DOCUSATE SODIUM 8.6-50 MG PO TABS: 1.0000 | ORAL_TABLET | Freq: Every evening | ORAL | Status: DC | PRN

## 2022-09-21 MED ORDER — DOCUSATE SODIUM 100 MG PO CAPS
100.0000 mg | ORAL_CAPSULE | Freq: Two times a day (BID) | ORAL | Status: DC
  Administered 2022-09-21 – 2022-09-23 (×4): 100 mg via ORAL
  Filled 2022-09-21 (×4): qty 1

## 2022-09-21 MED ORDER — LISINOPRIL 5 MG PO TABS
5.0000 mg | ORAL_TABLET | Freq: Every day | ORAL | Status: DC
  Administered 2022-09-22 – 2022-09-23 (×2): 5 mg via ORAL
  Filled 2022-09-21 (×2): qty 1

## 2022-09-21 MED ORDER — ONDANSETRON HCL 4 MG/2ML IJ SOLN
INTRAMUSCULAR | Status: DC | PRN
  Administered 2022-09-21: 4 mg via INTRAVENOUS

## 2022-09-21 MED ORDER — ALBUMIN HUMAN 5 % IV SOLN: INTRAVENOUS | Status: DC | PRN

## 2022-09-21 MED ORDER — ONDANSETRON HCL 4 MG PO TABS: 4.0000 mg | ORAL_TABLET | Freq: Four times a day (QID) | ORAL | Status: DC | PRN

## 2022-09-21 MED ORDER — SODIUM CHLORIDE 0.9% FLUSH
3.0000 mL | Freq: Two times a day (BID) | INTRAVENOUS | Status: DC
  Administered 2022-09-22: 3 mL via INTRAVENOUS

## 2022-09-21 MED ORDER — MORPHINE SULFATE (PF) 2 MG/ML IV SOLN: 1.0000 mg | INTRAVENOUS | Status: DC | PRN

## 2022-09-21 MED ORDER — VITAMIN E 45 MG (100 UNIT) PO CAPS
400.0000 [IU] | ORAL_CAPSULE | Freq: Every day | ORAL | Status: DC
  Administered 2022-09-21 – 2022-09-23 (×3): 400 [IU] via ORAL
  Filled 2022-09-21 (×3): qty 4

## 2022-09-21 MED ORDER — SODIUM CHLORIDE 0.9 % IV SOLN: INTRAVENOUS | Status: DC | PRN

## 2022-09-21 MED ORDER — LIDOCAINE 2% (20 MG/ML) 5 ML SYRINGE
INTRAMUSCULAR | Status: AC
  Filled 2022-09-21: qty 5

## 2022-09-21 MED ORDER — ALUM & MAG HYDROXIDE-SIMETH 200-200-20 MG/5ML PO SUSP: 30.0000 mL | Freq: Four times a day (QID) | ORAL | Status: DC | PRN

## 2022-09-21 MED ORDER — PANTOPRAZOLE SODIUM 40 MG PO TBEC
80.0000 mg | DELAYED_RELEASE_TABLET | Freq: Every day | ORAL | Status: DC
  Administered 2022-09-22 – 2022-09-23 (×2): 80 mg via ORAL
  Filled 2022-09-21 (×2): qty 2

## 2022-09-21 MED ORDER — MIDAZOLAM HCL 2 MG/2ML IJ SOLN
INTRAMUSCULAR | Status: DC | PRN
  Administered 2022-09-21: 2 mg via INTRAVENOUS

## 2022-09-21 MED ORDER — BUPIVACAINE LIPOSOME 1.3 % IJ SUSP
INTRAMUSCULAR | Status: AC
  Filled 2022-09-21: qty 20

## 2022-09-21 MED ORDER — PHENYLEPHRINE 80 MCG/ML (10ML) SYRINGE FOR IV PUSH (FOR BLOOD PRESSURE SUPPORT)
PREFILLED_SYRINGE | INTRAVENOUS | Status: DC | PRN
  Administered 2022-09-21: 80 ug via INTRAVENOUS
  Administered 2022-09-21 (×4): 160 ug via INTRAVENOUS
  Administered 2022-09-21: 80 ug via INTRAVENOUS

## 2022-09-21 MED ORDER — LIDOCAINE 2% (20 MG/ML) 5 ML SYRINGE
INTRAMUSCULAR | Status: DC | PRN
  Administered 2022-09-21: 100 mg via INTRAVENOUS

## 2022-09-21 MED ORDER — POVIDONE-IODINE 7.5 % EX SOLN
Freq: Once | CUTANEOUS | Status: DC
  Filled 2022-09-21: qty 118

## 2022-09-21 MED ORDER — CEFAZOLIN SODIUM-DEXTROSE 2-4 GM/100ML-% IV SOLN
2.0000 g | INTRAVENOUS | Status: AC
  Administered 2022-09-21 (×2): 2 g via INTRAVENOUS
  Filled 2022-09-21: qty 100

## 2022-09-21 MED ORDER — ACETAMINOPHEN 500 MG PO TABS
1000.0000 mg | ORAL_TABLET | Freq: Once | ORAL | Status: AC
  Administered 2022-09-21: 1000 mg via ORAL
  Filled 2022-09-21: qty 2

## 2022-09-21 MED ORDER — EPHEDRINE SULFATE-NACL 50-0.9 MG/10ML-% IV SOSY
PREFILLED_SYRINGE | INTRAVENOUS | Status: DC | PRN
  Administered 2022-09-21 (×5): 5 mg via INTRAVENOUS

## 2022-09-21 MED ORDER — OXYCODONE-ACETAMINOPHEN 5-325 MG PO TABS
1.0000 | ORAL_TABLET | ORAL | Status: DC | PRN
  Administered 2022-09-21 – 2022-09-23 (×9): 2 via ORAL
  Filled 2022-09-21 (×9): qty 2

## 2022-09-21 MED ORDER — FLEET ENEMA 7-19 GM/118ML RE ENEM: 1.0000 | ENEMA | Freq: Once | RECTAL | Status: DC | PRN

## 2022-09-21 MED ORDER — OXYCODONE HCL 5 MG PO TABS: 5.0000 mg | ORAL_TABLET | Freq: Once | ORAL | Status: DC | PRN

## 2022-09-21 MED ORDER — METHOCARBAMOL 500 MG PO TABS: 500.0000 mg | ORAL_TABLET | Freq: Four times a day (QID) | ORAL | 2 refills | Status: AC | PRN

## 2022-09-21 MED ORDER — HYDROMORPHONE HCL 1 MG/ML IJ SOLN
0.2500 mg | INTRAMUSCULAR | Status: DC | PRN
  Administered 2022-09-21 (×2): 0.25 mg via INTRAVENOUS

## 2022-09-21 MED ORDER — ONDANSETRON HCL 4 MG/2ML IJ SOLN
INTRAMUSCULAR | Status: AC
  Filled 2022-09-21: qty 2

## 2022-09-21 MED ORDER — EPHEDRINE 5 MG/ML INJ
INTRAVENOUS | Status: AC
  Filled 2022-09-21: qty 5

## 2022-09-21 MED ORDER — ZOLPIDEM TARTRATE 5 MG PO TABS: 5.0000 mg | ORAL_TABLET | Freq: Every evening | ORAL | Status: DC | PRN

## 2022-09-21 MED ORDER — OXYCODONE HCL 5 MG/5ML PO SOLN: 5.0000 mg | Freq: Once | ORAL | Status: DC | PRN

## 2022-09-21 MED ORDER — FENTANYL CITRATE (PF) 250 MCG/5ML IJ SOLN
INTRAMUSCULAR | Status: DC | PRN
  Administered 2022-09-21: 50 ug via INTRAVENOUS
  Administered 2022-09-21: 100 ug via INTRAVENOUS
  Administered 2022-09-21 (×2): 50 ug via INTRAVENOUS

## 2022-09-21 MED ORDER — AMISULPRIDE (ANTIEMETIC) 5 MG/2ML IV SOLN: 10.0000 mg | Freq: Once | INTRAVENOUS | Status: DC | PRN

## 2022-09-21 MED ORDER — DEXAMETHASONE SODIUM PHOSPHATE 10 MG/ML IJ SOLN
INTRAMUSCULAR | Status: AC
  Filled 2022-09-21: qty 1

## 2022-09-21 MED ORDER — SODIUM CHLORIDE 0.9% FLUSH: 3.0000 mL | INTRAVENOUS | Status: DC | PRN

## 2022-09-21 MED ORDER — PROPOFOL 10 MG/ML IV BOLUS
INTRAVENOUS | Status: AC
  Filled 2022-09-21: qty 20

## 2022-09-21 MED ORDER — POTASSIUM CHLORIDE IN NACL 20-0.9 MEQ/L-% IV SOLN: INTRAVENOUS | Status: DC

## 2022-09-21 MED ORDER — ORAL CARE MOUTH RINSE: 15.0000 mL | Freq: Once | OROMUCOSAL | Status: AC

## 2022-09-21 MED ORDER — FENTANYL CITRATE (PF) 250 MCG/5ML IJ SOLN
INTRAMUSCULAR | Status: AC
  Filled 2022-09-21: qty 5

## 2022-09-21 MED ORDER — METHYLPREDNISOLONE ACETATE 40 MG/ML IJ SUSP
INTRAMUSCULAR | Status: AC
  Filled 2022-09-21: qty 1

## 2022-09-21 MED ORDER — BUPIVACAINE-EPINEPHRINE (PF) 0.25% -1:200000 IJ SOLN
INTRAMUSCULAR | Status: DC | PRN
  Administered 2022-09-21: 22 mL
  Administered 2022-09-21: 8 mL

## 2022-09-21 MED ORDER — THROMBIN 20000 UNITS EX SOLR
CUTANEOUS | Status: AC
  Filled 2022-09-21: qty 20000

## 2022-09-21 MED ORDER — ACETAMINOPHEN 650 MG RE SUPP: 650.0000 mg | RECTAL | Status: DC | PRN

## 2022-09-21 MED ORDER — DEXAMETHASONE SODIUM PHOSPHATE 10 MG/ML IJ SOLN
INTRAMUSCULAR | Status: DC | PRN
  Administered 2022-09-21: 10 mg via INTRAVENOUS

## 2022-09-21 MED ORDER — THROMBIN 20000 UNITS EX SOLR: CUTANEOUS | Status: DC | PRN

## 2022-09-21 MED ORDER — BUPIVACAINE LIPOSOME 1.3 % IJ SUSP
INTRAMUSCULAR | Status: DC | PRN
  Administered 2022-09-21: 20 mL

## 2022-09-21 MED ORDER — SODIUM CHLORIDE 0.9 % IV SOLN
250.0000 mL | INTRAVENOUS | Status: DC
  Administered 2022-09-21: 250 mL via INTRAVENOUS

## 2022-09-21 MED ORDER — OXYCODONE-ACETAMINOPHEN 5-325 MG PO TABS: 1.0000 | ORAL_TABLET | ORAL | 0 refills | Status: AC | PRN

## 2022-09-21 MED ORDER — PHENYLEPHRINE 80 MCG/ML (10ML) SYRINGE FOR IV PUSH (FOR BLOOD PRESSURE SUPPORT)
PREFILLED_SYRINGE | INTRAVENOUS | Status: AC
  Filled 2022-09-21: qty 10

## 2022-09-21 MED ORDER — ONDANSETRON HCL 4 MG/2ML IJ SOLN: 4.0000 mg | Freq: Four times a day (QID) | INTRAMUSCULAR | Status: DC | PRN

## 2022-09-21 MED ORDER — BUPIVACAINE HCL (PF) 0.25 % IJ SOLN
INTRAMUSCULAR | Status: AC
  Filled 2022-09-21: qty 30

## 2022-09-21 MED ORDER — PHENYLEPHRINE HCL-NACL 20-0.9 MG/250ML-% IV SOLN
INTRAVENOUS | Status: DC | PRN
  Administered 2022-09-21: 135 ug/min via INTRAVENOUS
  Administered 2022-09-21: 100 ug/min via INTRAVENOUS

## 2022-09-21 SURGICAL SUPPLY — 78 items
BAG COUNTER SPONGE SURGICOUNT (BAG) IMPLANT
BENZOIN TINCTURE PRP APPL 2/3 (GAUZE/BANDAGES/DRESSINGS) IMPLANT
BUR ROUND FLUTED 5 RND (BURR) IMPLANT
BUR ROUND PRECISION 4.0 (BURR) IMPLANT
CABLE BIPOLOR RESECTION CORD (MISCELLANEOUS) IMPLANT
CAGE SABLE 10X30 6-12 8D (Cage) IMPLANT
CANNULA GRAFT BNE VG PRE-FILL (Bone Implant) IMPLANT
COVER SURGICAL LIGHT HANDLE (MISCELLANEOUS) IMPLANT
DISPENSER GRAFT BNE VG (MISCELLANEOUS) IMPLANT
DISPENSER VIVIGEN BONE GRAFT (MISCELLANEOUS) ×1 IMPLANT
DRAIN CHANNEL 15F RND FF W/TCR (WOUND CARE) IMPLANT
DRAPE POUCH INSTRU U-SHP 10X18 (DRAPES) IMPLANT
DRAPE SURG 17X23 STRL (DRAPES) IMPLANT
DURAPREP 26ML APPLICATOR (WOUND CARE) IMPLANT
ELECT BLADE 4.0 EZ CLEAN MEGAD (MISCELLANEOUS) ×1
ELECT CAUTERY BLADE 6.4 (BLADE) IMPLANT
ELECT REM PT RETURN 9FT ADLT (ELECTROSURGICAL) ×1
ELECTRODE BLDE 4.0 EZ CLN MEGD (MISCELLANEOUS) IMPLANT
ELECTRODE REM PT RTRN 9FT ADLT (ELECTROSURGICAL) IMPLANT
EVACUATOR SILICONE 100CC (DRAIN) IMPLANT
FILTER STRAW FLUID ASPIR (MISCELLANEOUS) IMPLANT
GAUZE 4X4 16PLY ~~LOC~~+RFID DBL (SPONGE) IMPLANT
GAUZE SPONGE 4X4 12PLY STRL LF (GAUZE/BANDAGES/DRESSINGS) IMPLANT
GLOVE BIO SURGEON STRL SZ7 (GLOVE) IMPLANT
GLOVE BIO SURGEON STRL SZ8 (GLOVE) IMPLANT
GLOVE BIOGEL PI IND STRL 7.0 (GLOVE) IMPLANT
GLOVE BIOGEL PI IND STRL 8 (GLOVE) IMPLANT
GOWN STRL REUS W/ TWL LRG LVL3 (GOWN DISPOSABLE) IMPLANT
GOWN STRL REUS W/ TWL XL LVL3 (GOWN DISPOSABLE) IMPLANT
GOWN STRL REUS W/TWL LRG LVL3 (GOWN DISPOSABLE) ×1
GOWN STRL REUS W/TWL XL LVL3 (GOWN DISPOSABLE) ×1
GRAFT BONE CANNULA VIVIGEN 3 (Bone Implant) ×6 IMPLANT
IV CATH 14GX2 1/4 (CATHETERS) IMPLANT
KIT BASIN OR (CUSTOM PROCEDURE TRAY) IMPLANT
KIT POSITION SURG JACKSON T1 (MISCELLANEOUS) IMPLANT
KIT TURNOVER KIT B (KITS) IMPLANT
NDL 22X1.5 STRL (OR ONLY) (MISCELLANEOUS) IMPLANT
NDL HYPO 25GX1X1/2 BEV (NEEDLE) IMPLANT
NDL SPNL 18GX3.5 QUINCKE PK (NEEDLE) IMPLANT
NEEDLE 22X1.5 STRL (OR ONLY) (MISCELLANEOUS) ×1 IMPLANT
NEEDLE HYPO 25GX1X1/2 BEV (NEEDLE) ×1 IMPLANT
NEEDLE SPNL 18GX3.5 QUINCKE PK (NEEDLE) ×2 IMPLANT
NS IRRIG 1000ML POUR BTL (IV SOLUTION) IMPLANT
PACK LAMINECTOMY ORTHO (CUSTOM PROCEDURE TRAY) IMPLANT
PACK UNIVERSAL I (CUSTOM PROCEDURE TRAY) IMPLANT
PAD ARMBOARD 7.5X6 YLW CONV (MISCELLANEOUS) IMPLANT
PATTIES SURGICAL .5 X.5 (GAUZE/BANDAGES/DRESSINGS) IMPLANT
ROD EXPEDIUM PREBENT 85MM (Rod) IMPLANT
ROD EXPEDIUM PREBENT 95MM (Rod) IMPLANT
SCREW CORTICAL VIPER 7X40MM (Screw) IMPLANT
SCREW PED VIPER FA 7X50 (Screw) IMPLANT
SCREW SET SINGLE INNER (Screw) IMPLANT
SCREW VIPER CORT FIX 6X35 (Screw) IMPLANT
SPONGE INTESTINAL PEANUT (DISPOSABLE) IMPLANT
SPONGE SURGIFOAM ABS GEL SZ50 (HEMOSTASIS) IMPLANT
STRIP CLOSURE SKIN 1/2X4 (GAUZE/BANDAGES/DRESSINGS) IMPLANT
SURGIFLO W/THROMBIN 8M KIT (HEMOSTASIS) IMPLANT
SUT BONE WAX W31G (SUTURE) IMPLANT
SUT ETHILON 2 0 FS 18 (SUTURE) IMPLANT
SUT MNCRL AB 4-0 PS2 18 (SUTURE) IMPLANT
SUT VIC AB 0 CT1 18XCR BRD 8 (SUTURE) IMPLANT
SUT VIC AB 0 CT1 8-18 (SUTURE) ×1
SUT VIC AB 1 CT1 18XCR BRD 8 (SUTURE) IMPLANT
SUT VIC AB 1 CT1 8-18 (SUTURE) ×2
SUT VIC AB 2-0 CT2 18 VCP726D (SUTURE) IMPLANT
SYR 20ML LL LF (SYRINGE) IMPLANT
SYR BULB IRRIG 60ML STRL (SYRINGE) IMPLANT
SYR CONTROL 10ML LL (SYRINGE) IMPLANT
SYR TB 1ML LUER SLIP (SYRINGE) IMPLANT
TAP EXPEDIUM DL 4.35 (INSTRUMENTS) IMPLANT
TAP EXPEDIUM DL 5.0 (INSTRUMENTS) IMPLANT
TAP EXPEDIUM DL 6.0 (INSTRUMENTS) IMPLANT
TAPE CLOTH SOFT 2X10 (GAUZE/BANDAGES/DRESSINGS) IMPLANT
TOWEL GREEN STERILE (TOWEL DISPOSABLE) IMPLANT
TOWEL GREEN STERILE FF (TOWEL DISPOSABLE) IMPLANT
TUBE FUNNEL GL DISP (ORTHOPEDIC DISPOSABLE SUPPLIES) IMPLANT
WATER STERILE IRR 1000ML POUR (IV SOLUTION) IMPLANT
YANKAUER SUCT BULB TIP NO VENT (SUCTIONS) IMPLANT

## 2022-09-21 NOTE — Anesthesia Postprocedure Evaluation (Signed)
Anesthesia Post Note  Patient: [facility]  Procedure(s) Performed: LEFT-SIDED TRANSFORAMINAL LUMBAR INTERBODY FUSION AND DECOMPRESSION LUMBAR 3- LUMBAR 4, LUMBAR 4 - LUMBAR 5, LUMBAR 5- SACRUM 1 WITH INSTRUMENTATION AND ALLOGRAFT (Left: Spine Lumbar)     Patient location during evaluation: PACU Anesthesia Type: General Level of consciousness: awake Pain management: pain level controlled Vital Signs Assessment: post-procedure vital signs reviewed and stable Respiratory status: spontaneous breathing, nonlabored ventilation and respiratory function stable Cardiovascular status: blood pressure returned to baseline and stable Postop Assessment: no apparent nausea or vomiting Anesthetic complications: no   No notable events documented.  Last Vitals:  Vitals:   09/21/22 1645 09/21/22 1703  BP: (!) 104/56 129/68  Pulse: 94 92  Resp: 11 17  Temp: 36.7 C 36.7 C  SpO2: 95% 96%    Last Pain:  Vitals:   09/21/22 1703  TempSrc: Oral  PainSc:                  [name]

## 2022-09-21 NOTE — Anesthesia Procedure Notes (Addendum)
Procedure Name: Intubation Date/Time: 09/21/2022 7:55 AM  Performed by: Michele Rockers, CRNAPre-anesthesia Checklist: Patient identified, Patient being monitored, Timeout performed, Emergency Drugs available and Suction available Patient Re-evaluated:Patient Re-evaluated prior to induction Oxygen Delivery Method: Circle System Utilized Preoxygenation: Pre-oxygenation with 100% oxygen Induction Type: IV induction Ventilation: Two handed mask ventilation required Laryngoscope Size: Mac and 4 Grade View: Grade I Tube type: Oral Tube size: 7.5 mm Number of attempts: 1 Airway Equipment and Method: Stylet and Video-laryngoscopy Placement Confirmation: ETT inserted through vocal cords under direct vision, positive ETCO2 and breath sounds checked- equal and bilateral Secured at: 23 cm Tube secured with: Tape Dental Injury: Teeth and Oropharynx as per pre-operative assessment  Difficulty Due To: Difficult Airway- due to large tongue and Difficult Airway- due to anterior larynx Comments: Attempted intubation with Mill 2, unable to view VC,  2nd attempt with glide Mac 4 successful.

## 2022-09-21 NOTE — Transfer of Care (Signed)
Immediate Anesthesia Transfer of Care Note  Patient: [facility]  Procedure(s) Performed: LEFT-SIDED TRANSFORAMINAL LUMBAR INTERBODY FUSION AND DECOMPRESSION LUMBAR 3- LUMBAR 4, LUMBAR 4 - LUMBAR 5, LUMBAR 5- SACRUM 1 WITH INSTRUMENTATION AND ALLOGRAFT (Left: Spine Lumbar)  Patient Location: PACU  Anesthesia Type:General  Level of Consciousness: awake and alert   Airway & Oxygen Therapy: Patient Spontanous Breathing and Patient connected to face mask oxygen  Post-op Assessment: Report given to RN, Post -op Vital signs reviewed and stable, and Patient moving all extremities X 4  Post vital signs: Reviewed and stable  Last Vitals:  Vitals Value Taken Time  BP 111/62 09/21/22 1530  Temp    Pulse 110 09/21/22 1533  Resp 30 09/21/22 1533  SpO2 95 % 09/21/22 1533  Vitals shown include unvalidated device data.  Last Pain:  Vitals:   09/21/22 0550  TempSrc:   PainSc: 0-No pain      Patients Stated Pain Goal: 0 (61/44/31 5400)  Complications: No notable events documented.

## 2022-09-21 NOTE — Op Note (Signed)
PATIENT NAME: [facility]   MEDICAL RECORD NO.:   [id number]    DATE OF BIRTH: 26-Jan-1951   DATE OF PROCEDURE: 09/21/2022                                 OPERATIVE REPORT     PREOPERATIVE DIAGNOSES: 1. Spinal stenosis L3-4, L4-5, L5/S1 (M48.062) 2. Bilateral lumbar radiculopathy (M54.16) 3. L3-4, L4/5 L5/S1 spondylolisthesis (M53.2X6) 4. S/p previous L4/5 and L5/S1 decompressions   POSTOPERATIVE DIAGNOSES: 1. Spinal stenosis L3-4, L4-5, L5/S1 (M48.062) 2. Bilateral lumbar radiculopathy (M54.16) 3. L3-4, L5/S1 spondylolisthesis (M53.2X6)   PROCEDURES: 1. Lumbar decompression, L3-4, L4-5, L5/S1 including bilateral partial facetectomy, and bilateral lumbar decompression 2. Left-sided L3-4, L4-5, L5/S1 transforaminal lumbar interbody fusion. 3. Right-sided L3-4, L4-5, L5/S1 posterolateral fusion. 4. Insertion of interbody device x 3 (Globus expandable intervertebral spacers). 5. Placement of segmental posterior instrumentation L3, L4, L5, S1, bilaterally. 6. Use of local autograft. 7. Use of morselized allograft - ViviGen. 8. Intraoperative use of fluoroscopy.   SURGEON:  Phylliss Bob, MD.   ASSISTANTPricilla Holm, PA-C.   ANESTHESIA:  General endotracheal anesthesia.   COMPLICATIONS:  None.   DISPOSITION:  Stable.   ESTIMATED BLOOD LOSS:  600cc (275cc retransfused via Cell Saver)   INDICATIONS FOR SURGERY:  Briefly, [facility] is a pleasant 72 -year-old male who did present to me with severe and ongoing pain in the bilateral legs. I did feel that the symptoms were secondary to the findings noted above.  The patient failed conservative care and did wish to proceed with the procedure noted above.    OPERATIVE DETAILS:  On 09/21/2022 , the patient was brought to surgery and general endotracheal anesthesia was administered.  The patient was placed prone on a well-padded flat Jackson bed with a spinal frame.  Antibiotics were given and a time-out procedure was  performed. The back was prepped and draped in the usual fashion.  A midline incision was made overlying the L3-4, L4-5, and L5/S1 intervertebral spaces. The fascia was incised at the midline.  The paraspinal musculature was bluntly swept laterally.  Anatomic landmarks for the pedicles and S1 pedicles bilaterally, using a medial to lateral cortical trajectory technique.  On the right side, the posterolateral gutter and facet joints at L3-4, L4-5, and L5/S1 were decorticated and 6 mm screws of the appropriate length were placed at the L3, L4,L5 pedicles, and a 7 mm screw was placed S1, and an 85-mm rod was placed, followed by caps.  On the left side, the cannulated pedicle holes were filled with bone wax.  I then proceeded with the decompressive aspect of the procedure.  Starting at L5/S1, I did perform a laminotomy and a full facetectomy on the left.  I was able to thoroughly and entirely decompress the L5/S1 intervertebral space bilaterally, removing facet hypertrophy and ligamentum flavum hypertrophy.  There was significant neuroforaminal stenosis, which was decompressed as well.  At this point, with an assistant holding medial retraction of the traversing left S1 nerve, I did perform a thorough and complete L5/S1 intervertebral discectomy.  The intervertebral space was then liberally packed with autograft from the decompression, as well as allograft in the form of ViviGen, as was the appropriately sized intervertebral spacer.  The spacer was then expanded to approximately 8.4 mm in height.  I was very pleased with the final resting position and press-fit of the spacer. D  I then turned my  attention to the L4-5 intervertebral level. I did perform a laminotomy and a full facetectomy on the left.  I was able to thoroughly and entirely decompress the L4-5 intervertebral space bilaterally, removing facet hypertrophy and ligamentum flavum hypertrophy, in addition to a prominent L4-5 disc herniation.  This did  extend into the left lateral recess and foraminal region, and was very much adherent to the exiting left L4 nerve.  I was however able to meticulously develop a plane between the exiting left L4 nerve and the disc herniation immediately ventral to it, after which point the protruding fragments were removed.  The left L4 and L5 nerves were thoroughly decompressed. At this point, with an assistant holding medial retraction of the traversing left L5 nerve, I did perform a thorough and complete L4-5 intervertebral discectomy.  The intervertebral space was then liberally packed with autograft from the decompression, as well as allograft in the form of ViviGen, as was the appropriately sized intervertebral spacer.  The spacer was then expanded to approximately 8 mm in height.  I was very pleased with the final resting position and press-fit of the spacer.   I then turned my attention to the L3-4 level.  Once again, it was clearly evident that there was stenosis at the L3-4 level.  The stenosis was thoroughly and adequately decompressed by performing a bilateral partial facetectomy.  I was able to thoroughly decompress the intervertebral disc space at L3-4 bilaterally.  With an assistant holding medial retraction of the traversing left L4 nerve, I did perform an annulotomy at the posterolateral aspect of the L3-4 intervertebral space.  I then used a series of curettes and pituitary rongeurs to perform a thorough and complete intervertebral diskectomy.  The intervertebral space was then liberally packed with autograft as well as allograft in the form of ViviGen, as was the appropriate-sized intervertebral spacer.  The spacer was then tamped into position in the usual fashion, and expanded to approximately 11 mm in height.  I was very pleased with the press-fit of the spacer.    I then placed 6 mm screws on the left at L3, L4, L5, and a 7 mm screw at S1.  A 95-mm rod was then placed and caps were placed. All 6 caps  were then locked.  The wound was copiously irrigated with a total of approximately 3 L prior to placing the bone graft.  Additional autograft and allograft were then packed into the posterolateral gutter on the right side to help aid in the success of the fusion. The wound was explored for any undue bleeding and there was no substantial bleeding encountered.  I did however elect to place a #15 deep Blake drain, as there were minor areas of oozing throughout the surgical bed.  Gel-Foam was placed over the laminectomy site.  The wound was then closed in layers using #1 Vicryl followed by 2-0 Vicryl, followed by 4-0 Monocryl.  Benzoin and Steri-Strips were applied followed by sterile dressing.       Of note, Pricilla Holm was my assistant throughout surgery, and did aid in retraction, suctioning, the decompression, placement of the hardware, and closure.     Phylliss Bob, MD

## 2022-09-21 NOTE — H&P (Signed)
PREOPERATIVE H&P  Chief Complaint: Bilateral leg pain  HPI: Noah Jensen is a 73 y.o. male who presents with ongoing pain in the bilateral legs  MRI reveals spinal stenosis and instability at L3/4, L4/5 and L5/S1. Patient is s/p previous decompressions at L4/5 and L5/S1.  Patient has failed multiple forms of conservative care and continues to have pain (see office notes for additional details regarding the patient's full course of treatment)  Past Medical History:  Diagnosis Date   Anosmia    ENT thought likely related to chronic small vessel ischemic changes (09/21/21)   Arthritis    CVA (cerebral vascular accident) ([place])    right occipital lobe infarct 05/06/17 MRI   Diabetes mellitus without complication ([place])    Dry cough 02/06/2014   Exertional dyspnea    GERD (gastroesophageal reflux disease)    Hypertension    Multinodular thyroid    Prediabetes    Prostate cancer ([place])    s/p EBRT 05/2015   Sleep apnea    02/07/22 HST: mild OSA w/ AHI 8, intolerant to CPAP (05/02/22)   Past Surgical History:  Procedure Laterality Date   ANTERIOR CERVICAL DECOMP/DISCECTOMY FUSION N/A 05/04/2022   Procedure: ANTERIOR CERVICAL DECOMPRESSION FUSION CERVICAL 3- CERVICAL 4 WITH INSTRUMENTATION AND ALLOGRAFT;  Surgeon: Phylliss Bob, MD;  Location: [facility];  Service: Orthopedics;  Laterality: N/A;   BACK SURGERY  09/18/2009   lumbar   FRACTURE SURGERY Right    ORIF ankle   JOINT REPLACEMENT Right 2018   LUMBAR LAMINECTOMY/DECOMPRESSION MICRODISCECTOMY Right 02/11/2014   Procedure: MICRO LUMBAR DECOMPRESSION, MICRODISCECTOMY,  LATERAL MASS FUSION WITH AUTOGRAFT  LUMBAR FIVE TO SACRAL ONE  ON THE RIGHT, REDO MICRODISCECTOMY LUMBAR FOUR TO LUMBAR FIVE;  Surgeon: Johnn Hai, MD;  Location: [facility];  Service: Orthopedics;  Laterality: Right;   PARATHYROIDECTOMY     Social History   Socioeconomic History   Marital status: Married    Spouse name: Not on file   Number of children: Not on  file   Years of education: Not on file   Highest education level: Not on file  Occupational History   Not on file  Tobacco Use   Smoking status: Former    Packs/day: 2.00    Years: 20.00    Total pack years: 40.00    Types: Cigarettes    Quit date: 02/07/2004    Years since quitting: 18.6   Smokeless tobacco: Never  Vaping Use   Vaping Use: Never used  Substance and Sexual Activity   Alcohol use: Not Currently    Comment: none in the last year   Drug use: Not Currently    Types: Marijuana    Comment: none in the last year marijuana   Sexual activity: Not on file  Other Topics Concern   Not on file  Social History Narrative   Not on file   Social Determinants of Health   Financial Resource Strain: Not on file  Food Insecurity: Not on file  Transportation Needs: Not on file  Physical Activity: Not on file  Stress: Not on file  Social Connections: Not on file   History reviewed. No pertinent family history. Allergies  Allergen Reactions   Crestor [Rosuvastatin]     Made pt feel uncomfortable    Prior to Admission medications   Medication Sig Start Date End Date Taking? Authorizing Provider  amLODipine (NORVASC) 10 MG tablet Take 10 mg by mouth daily.   Yes [provider]  aspirin  EC 81 MG tablet Take 81 mg by mouth daily. Swallow whole.   Yes [provider]  Cholecalciferol (VITAMIN D) 50 MCG (2000 UT) CAPS Take 2,000 Units by mouth daily.   Yes [provider]  Coenzyme Q10 (COQ10) 100 MG CAPS Take 100 mg by mouth daily.   Yes [provider]  lisinopril (ZESTRIL) 5 MG tablet Take 5 mg by mouth daily.   Yes [provider]  magnesium oxide (MAG-OX) 400 (240 Mg) MG tablet Take 400 mg by mouth daily.   Yes [provider]  omeprazole (PRILOSEC) 20 MG capsule Take 20 mg by mouth daily.   Yes [provider]  pravastatin (PRAVACHOL) 40 MG tablet Take 40 mg by mouth every other day.   Yes [provider]  tadalafil (CIALIS) 5 MG tablet Take 5 mg by mouth every other day.   Yes [provider]  tamsulosin (FLOMAX) 0.4 MG CAPS capsule Take 0.4 mg by mouth daily.   Yes [provider]  tetrahydrozoline-zinc (VISINE-AC) 0.05-0.25 % ophthalmic solution Place 1 drop into both eyes 3 (three) times daily as needed (dry eyes).   Yes [provider]  vitamin E 180 MG (400 UNITS) capsule Take 400 Units by mouth daily.   Yes [provider]  HYDROcodone-acetaminophen (NORCO/VICODIN) 5-325 MG tablet Take 1 tablet by mouth every 6 (six) hours as needed for moderate pain or severe pain. 05/04/22 05/04/23  McKenzie, Lennie Muckle, PA-C  methocarbamol (ROBAXIN) 750 MG tablet Take 1 tablet (750 mg total) by mouth every 6 (six) hours as needed for muscle spasms. Patient not taking: Reported on 09/04/2022 05/04/22   Justice Britain, PA-C     All other systems have been reviewed and were otherwise negative with the exception of those mentioned in the HPI and as above.  Physical Exam: Vitals:   09/21/22 0538  BP: 139/70  Pulse: 70  Resp: 17  Temp: 98.7 F (37.1 C)  SpO2: 97%    Body mass index is 33.23 kg/m.  General: Alert, no acute distress Cardiovascular: No pedal edema Respiratory: No cyanosis, no use of accessory musculature Skin: No lesions in the area of chief complaint Neurologic: Sensation intact distally Psychiatric: Patient is competent for consent with normal mood and affect Lymphatic: No axillary or cervical lymphadenopathy   Assessment/Plan: Neurogenic claudication secondary to moderate to severe stenosis involving L3-L4, L4-L5, and L5-S1, with a spondylolisthesis noted at each of these levels.   Plan for Procedure(s): LEFT-SIDED TRANSFORAMINAL LUMBAR INTERBODY FUSION AND DECOMPRESSION LUMBAR 3- LUMBAR 4, LUMBAR 4 - LUMBAR 5, LUMBAR 5- SACRUM 1 WITH INSTRUMENTATION AND ALLOGRAFT   Norva Karvonen, MD 09/21/2022 6:32 AM

## 2022-09-22 DIAGNOSIS — M5416 Radiculopathy, lumbar region: Secondary | ICD-10-CM | POA: Diagnosis not present

## 2022-09-22 LAB — GLUCOSE, CAPILLARY
Glucose-Capillary: 118 mg/dL — ABNORMAL HIGH (ref 70–99)
Glucose-Capillary: 110 mg/dL — ABNORMAL HIGH (ref 70–99)

## 2022-09-22 NOTE — Evaluation (Signed)
Physical Therapy Evaluation Patient Details Name: Noah Jensen MRN: [id number] DOB: 1950/10/25 Today's Date: 09/22/2022  History of Present Illness  Patient is a 72 y/o male who presents on 09/21/22 for left sided TLIF and decompression of L3-S1. PMH includes prior back sx, CVA, DM, HTN, sleep apnea.  Clinical Impression  Patient presents with pain, decreased sensation BLEs and post surgical deficits s/p above surgery. Pt lives at home with his wife and reports being independent for ADLs/IADLs at baseline. Today, pt requires Min A for bed mobility and supervision for transfers. Tolerated gait training and stair training with min guard-Min A and use of RW for support. Wife present for stair training. Education re: back precautions, brace wearing, walking program, positioning, log roll technique, car transfer etc. Discussed sleeping in recliner at home vs buying step stool with rail to get into bed due to high height. Pt will have support of spouse at home. Will follow acutely to maximize independence and mobility prior to return home.       Recommendations for follow up therapy are one component of a multi-disciplinary discharge planning process, led by the attending physician.  Recommendations may be updated based on patient status, additional functional criteria and insurance authorization.  Follow Up Recommendations No PT follow up      Assistance Recommended at Discharge Intermittent Supervision/Assistance  Patient can return home with the following  A little help with walking and/or transfers;Assistance with cooking/housework;Assist for transportation;Help with stairs or ramp for entrance;A little help with bathing/dressing/bathroom    Equipment Recommendations Rolling walker (2 wheels);BSC/3in1  Recommendations for Other Services       Functional Status Assessment Patient has had a recent decline in their functional status and demonstrates the ability to make significant improvements in  function in a reasonable and predictable amount of time.     Precautions / Restrictions Precautions Precautions: Back Precaution Booklet Issued: Yes (comment) Required Braces or Orthoses: Spinal Brace Spinal Brace: Applied in standing position;Applied in sitting position;Thoracolumbosacral orthotic (brace on at all times OOB except nighttime trips to bathroom) Restrictions Weight Bearing Restrictions: No      Mobility  Bed Mobility Overal bed mobility: Needs Assistance Bed Mobility: Rolling, Sidelying to Sit, Sit to Sidelying Rolling: Supervision Sidelying to sit: Supervision     Sit to sidelying: Min assist General bed mobility comments: Assist needed to bring RLE itno bed to return to sidelying, cues for log roll technique with HOB flat and no use of rails to simulate home. Pt has high bed at home and will be sleeping in recliner for a few days, recommended step stool for bed use at home.    Transfers Overall transfer level: Needs assistance   Transfers: Sit to/from Stand Sit to Stand: Supervision           General transfer comment: Supervision for safety. Stood from [employer].    Ambulation/Gait Ambulation/Gait assistance: Min guard Gait Distance (Feet): 150 Feet Assistive device: Rolling walker (2 wheels) Gait Pattern/deviations: Step-through pattern, Decreased stride length, Trunk flexed, Decreased dorsiflexion - right Gait velocity: decreased Gait velocity interpretation: <1.31 ft/sec, indicative of household ambulator   General Gait Details: Slow, mostly steady gait with decreased DF right foot from prior fx, cues for upright posture.  Stairs Stairs: Yes Stairs assistance: Min assist Stair Management: One rail Left, Step to pattern Number of Stairs: 5 General stair comments: Cues for technique/safety, step to pattern to ascend/descend steps and HHA on other side for support, wife present.  Wheelchair Mobility  Modified Rankin (Stroke Patients Only)        Balance Overall balance assessment: Mild deficits observed, not formally tested                                           Pertinent Vitals/Pain Pain Assessment Pain Assessment: 0-10 Pain Score: 5  Pain Location: back Pain Descriptors / Indicators: Sore, Operative site guarding Pain Intervention(s): Monitored during session, Repositioned    Home Living Family/patient expects to be discharged to:: Private residence Living Arrangements: Spouse/significant other Available Help at Discharge: Family;Available 24 hours/day Type of Home: House Home Access: Stairs to enter Entrance Stairs-Rails: Psychiatric nurse of Steps: 2 vs 6   Home Layout: One level Home Equipment: None      Prior Function Prior Level of Function : Independent/Modified Independent             Mobility Comments: Independent, drives. Does some IADLs. Loves to ride motorcycles. ADLs Comments: independent     Hand Dominance        Extremity/Trunk Assessment   Upper Extremity Assessment Upper Extremity Assessment: Defer to OT evaluation    Lower Extremity Assessment Lower Extremity Assessment: RLE deficits/detail;LLE deficits/detail RLE Deficits / Details: decreased sensation hip to knee RLE Sensation: decreased light touch LLE Deficits / Details: decreased sensation hip to knee LLE Sensation: decreased light touch    Cervical / Trunk Assessment Cervical / Trunk Assessment: Back Surgery  Communication   Communication: No difficulties  Cognition Arousal/Alertness: Awake/alert Behavior During Therapy: WFL for tasks assessed/performed Overall Cognitive Status: Within Functional Limits for tasks assessed                                          General Comments General comments (skin integrity, edema, etc.): Wife present during session. Discussed brace wearing./management, asking where the manual was for donning/doffing.    Exercises      Assessment/Plan    PT Assessment Patient needs continued PT services  PT Problem List Decreased strength;Decreased mobility;Decreased knowledge of precautions;Decreased balance;Decreased knowledge of use of DME;Impaired sensation;Pain;Decreased skin integrity       PT Treatment Interventions Therapeutic activities;DME instruction;Gait training;Stair training;Balance training;Functional mobility training;Therapeutic exercise;Patient/family education    PT Goals (Current goals can be found in the Care Plan section)  Acute Rehab PT Goals Patient Stated Goal: to get back to walking and riding my motorcycle PT Goal Formulation: With patient/family Time For Goal Achievement: 10/06/22 Potential to Achieve Goals: Good    Frequency Min 5X/week     Co-evaluation               AM-PAC PT "6 Clicks" Mobility  Outcome Measure Help needed turning from your back to your side while in a flat bed without using bedrails?: A Little Help needed moving from lying on your back to sitting on the side of a flat bed without using bedrails?: A Little Help needed moving to and from a bed to a chair (including a wheelchair)?: A Little Help needed standing up from a chair using your arms (e.g., wheelchair or bedside chair)?: A Little Help needed to walk in hospital room?: A Little Help needed climbing 3-5 steps with a railing? : A Little 6 Click Score: 18    End of Session Equipment Utilized During Treatment:  Back brace;Gait belt Activity Tolerance: Patient tolerated treatment well Patient left: in bed;with call bell/phone within reach;with family/visitor present (sitting EOB) Nurse Communication: Mobility status PT Visit Diagnosis: Pain;Muscle weakness (generalized) (M62.81);Difficulty in walking, not elsewhere classified (R26.2) Pain - part of body:  (back)    Time: 6728-9791 PT Time Calculation (min) (ACUTE ONLY): 29 min   Charges:   PT Evaluation $PT Eval Low Complexity: 1 Low PT  Treatments $Gait Training: 8-22 mins        Marisa Severin, PT, DPT Acute Rehabilitation Services Secure chat preferred Office [place] 09/22/2022, 8:45 AM

## 2022-09-22 NOTE — Progress Notes (Signed)
    Patient doing well postop day 1 from 3 level L3-S1 decompression and fusion.  He reports resolution of preoperative bilateral leg pain.  He does have some residual heaviness and achiness in his bilateral legs.  He states his anterior thighs feel a bit numb but he has been able to get up and ambulate.  He has been using the bathroom normally.  He denies any saddle anesthesia.  He denies weakness in the legs and states they just feel heavy.  He has been up and walking the halls once or twice and is pending physical therapy.  He is wearing his TLSO brace appropriately.  His wife accompanies him.  He had rather prominent low back pain overnight but states is doing much better now.  He is eager to progress him.  His drain was just emptied within the hour.   Physical Exam: Vitals:   09/22/22 0340 09/22/22 0844  BP: 119/76 124/69  Pulse: 84 78  Resp: 20 18  Temp: 98.4 F (36.9 C)   SpO2: 97% 98%  Drain output 160 from 7 PM to 7 AM, drain output 40 in the last 50 minutes  Dressing in place, CDI, patient sitting at edge of bed with TLSO brace worn appropriately very comfortably.  Distal compartments soft. NVI  POD #1 s/p L3-S1 decompression and fusion with resolution of preoperative bilateral leg pain.  Expected postoperative low back pain.  Expected heaviness and achiness in the legs favoring the anterior thighs.  - up with PT/OT, encourage ambulation  -TLSO brace at all times when upright or out of bed/recliner - Percocet for pain, Robaxin for muscle spasms  -sent electronically to patient's pharmacy - likely d/c home today with f/u in 2 weeks  -pending clearance from PT/OT  -Pending drain output, we will check patient's drain status over lunch to make a discharge plan

## 2022-09-22 NOTE — Evaluation (Signed)
Occupational Therapy Evaluation Patient Details Name: Noah Jensen MRN: [id number] DOB: Feb 04, 1951 Today's Date: 09/22/2022   History of Present Illness Patient is a 72 y/o male who presents on 09/21/22 for left sided TLIF and decompression of L3-S1. PMH includes prior back sx, CVA, DM, HTN, sleep apnea.   Clinical Impression   Patient evaluated by Occupational Therapy with no further acute OT needs identified. All education has been completed and the patient has no further questions. See below for any follow-up Occupational Therapy or equipment needs. OT to sign off. Thank you for referral.        Recommendations for follow up therapy are one component of a multi-disciplinary discharge planning process, led by the attending physician.  Recommendations may be updated based on patient status, additional functional criteria and insurance authorization.   Follow Up Recommendations  No OT follow up     Assistance Recommended at Discharge PRN  Patient can return home with the following A little help with walking and/or transfers;A little help with bathing/dressing/bathroom    Functional Status Assessment  Patient has had a recent decline in their functional status and demonstrates the ability to make significant improvements in function in a reasonable and predictable amount of time.  Equipment Recommendations  BSC/3in1;Other (comment) (urinal given)    Recommendations for Other Services       Precautions / Restrictions Precautions Precautions: Back Precaution Booklet Issued: Yes (comment) Required Braces or Orthoses: Spinal Brace Spinal Brace: Thoracolumbosacral orthotic;Applied in sitting position Restrictions Weight Bearing Restrictions: No      Mobility Bed Mobility               General bed mobility comments: sitting eob on arrival.    Transfers Overall transfer level: Needs assistance   Transfers: Sit to/from Stand Sit to Stand: Min assist            General transfer comment: pt requires mod cues for hand placement and physical (A) to power up from surface      Balance Overall balance assessment: Mild deficits observed, not formally tested                                         ADL either performed or assessed with clinical judgement   ADL Overall ADL's : Needs assistance/impaired Eating/Feeding: Independent   Grooming: Wash/dry hands;Wash/dry face;Modified independent;Sitting           Upper Body Dressing : Modified independent;Sitting   Lower Body Dressing: Supervision/safety;Adhering to back precautions;Sit to/from stand;With adaptive equipment Lower Body Dressing Details (indicate cue type and reason): educated on use of reacher and sock aide. plans to purchase    Back handout provided and reviewed adls in detail. Pt educated on: clothing between brace, never sleep in brace, set an alarm at night for medication, avoid sitting for long periods of time, correct bed positioning for sleeping, correct sequence for bed mobility, avoiding lifting more than 5 pounds and never wash directly over incision. All education is complete and patient indicates understanding.                  Vision Baseline Vision/History: 1 Wears glasses Ability to See in Adequate Light: 0 Adequate Patient Visual Report: No change from baseline       Perception     Praxis      Pertinent Vitals/Pain Pain Assessment Pain Assessment: 0-10 Pain Score: 5  Pain Location: back Pain Descriptors / Indicators: Sore, Operative site guarding Pain Intervention(s): Premedicated before session, Repositioned     Hand Dominance Right   Extremity/Trunk Assessment Upper Extremity Assessment Upper Extremity Assessment: Overall WFL for tasks assessed   Lower Extremity Assessment Lower Extremity Assessment: Defer to PT evaluation RLE Deficits / Details: decreased sensation hip to knee RLE Sensation: decreased light touch LLE  Deficits / Details: decreased sensation hip to knee LLE Sensation: decreased light touch   Cervical / Trunk Assessment Cervical / Trunk Assessment: Back Surgery   Communication Communication Communication: No difficulties   Cognition Arousal/Alertness: Awake/alert Behavior During Therapy: WFL for tasks assessed/performed Overall Cognitive Status: Within Functional Limits for tasks assessed                                       General Comments  drain in place and pt pending d/c home with drain per RN staff. family and pt educated on use of safety pin to hold drain over the next 24 hours    Exercises     Shoulder Instructions      Home Living Family/patient expects to be discharged to:: Private residence Living Arrangements: Spouse/significant other Available Help at Discharge: Family;Available 24 hours/day Type of Home: House Home Access: Stairs to enter [employer] of Steps: 2 vs 6 Entrance Stairs-Rails: Right;Left Home Layout: One level     Bathroom Shower/Tub: Teacher, early years/pre: Standard Bathroom Accessibility: No   Home Equipment: None          Prior Functioning/Environment Prior Level of Function : Independent/Modified Independent             Mobility Comments: Independent, drives. Does some IADLs. Loves to ride motorcycles. ADLs Comments: independent        OT Problem List: Decreased strength;Impaired balance (sitting and/or standing);Decreased safety awareness;Decreased knowledge of use of DME or AE;Decreased knowledge of precautions      OT Treatment/Interventions: Self-care/ADL training;DME and/or AE instruction;Therapeutic activities;Patient/family education;Balance training    OT Goals(Current goals can be found in the care plan section) Acute Rehab OT Goals Patient Stated Goal: wife expressed concern over jp drain going home and pending bad weather of freezing rain OT Goal Formulation: With patient   OT Frequency: Min 2X/week    Co-evaluation              AM-PAC OT "6 Clicks" Daily Activity     Outcome Measure Help from another person eating meals?: None Help from another person taking care of personal grooming?: None Help from another person toileting, which includes using toliet, bedpan, or urinal?: A Little Help from another person bathing (including washing, rinsing, drying)?: A Little Help from another person to put on and taking off regular upper body clothing?: None Help from another person to put on and taking off regular lower body clothing?: A Little 6 Click Score: 21   End of Session Equipment Utilized During Treatment: Gait belt;Rolling walker (2 wheels) Nurse Communication: Mobility status;Precautions  Activity Tolerance: Patient tolerated treatment well Patient left: in bed;with call bell/phone within reach;with family/visitor present  OT Visit Diagnosis: Unsteadiness on feet (R26.81)                Time: 8366-2947 OT Time Calculation (min): 44 min Charges:  OT General Charges $OT Visit: 1 Visit OT Evaluation $OT Eval Moderate Complexity: 1 Mod OT Treatments $Self Care/Home Management :  23-37 mins   Fleeta Emmer, OTR/L  Acute Rehabilitation Services Office: [phone] .   Jeri Modena 09/22/2022, 10:16 AM

## 2022-09-23 DIAGNOSIS — M5416 Radiculopathy, lumbar region: Secondary | ICD-10-CM | POA: Diagnosis not present

## 2022-09-23 NOTE — Progress Notes (Addendum)
    Patient doing well postop day 2 from 3 level L3-S1 decompression and fusion.  Up walking the hall with PT.  Reports L LE still numb, but pain better.  + voiding, No BM or flatus, but is tol PO.  He is wearing his TLSO brace appropriately.  His wife accompanies him.  Drain output lessening.   Physical Exam: Tmax 99.5, Tc 98.4, VSS  Drain output 60 1600-2300, 40 2300-0500   Dressing in place, CDI, patient sitting at edge of bed with TLSO brace worn appropriately very comfortably.  Distal compartments soft. NVI  POD #2 s/p L3-S1 decompression and fusion with resolution of preoperative bilateral leg pain.  Expected postoperative low back pain.  Expected heaviness and achiness in the legs favoring the anterior thighs.  - up with PT/OT, encourage ambulation  -TLSO brace at all times when upright or out of bed/recliner - Percocet for pain, Robaxin for muscle spasms  -sent electronically to patient's pharmacy - likely d/c home today with f/u in 2 weeks--will check on acceptability of drain output with Dr. Enid Baas  Addendum:  Patient to remain for an additional 6 hour period of drain observation.  Will check output at 11am.  Patient can be transferred to another floor if necessary for needs of the hospital.

## 2022-09-23 NOTE — Care Management (Signed)
Patient with order to DC to home today. Unit staff to provide DME needed for home.   No HH needs identified Patient will have family/ friends provide transportation home. No other TOC needs identified for DC 

## 2022-09-23 NOTE — Discharge Summary (Signed)
Physician Discharge Summary  Patient ID: Yandiel Bergum MRN: [id number] DOB/AGE: 11/11/1950 72 y.o.  Admit date: 09/21/2022 Discharge date: 09/23/2022  Admission Diagnoses:  Radiculopathy, lumbar region  Discharge Diagnoses:  Principal Problem:   Radiculopathy, lumbar region   Past Medical History:  Diagnosis Date   Anosmia    ENT thought likely related to chronic small vessel ischemic changes (09/21/21)   Arthritis    CVA (cerebral vascular accident) ([place])    right occipital lobe infarct 05/06/17 MRI   Diabetes mellitus without complication ([place])    Dry cough 02/06/2014   Exertional dyspnea    GERD (gastroesophageal reflux disease)    Hypertension    Multinodular thyroid    Prediabetes    Prostate cancer ([place])    s/p EBRT 05/2015   Sleep apnea    02/07/22 HST: mild OSA w/ AHI 8, intolerant to CPAP (05/02/22)    Surgeries: Procedure(s): LEFT-SIDED TRANSFORAMINAL LUMBAR INTERBODY FUSION AND DECOMPRESSION LUMBAR 3- LUMBAR 4, LUMBAR 4 - LUMBAR 5, LUMBAR 5- SACRUM 1 WITH INSTRUMENTATION AND ALLOGRAFT on 09/21/2022   Consultants (if any):   Discharged Condition: Improved  Hospital Course: Kanav Kazmierczak is an 72 y.o. male who was admitted 09/21/2022 with a diagnosis of Radiculopathy, lumbar region and went to the operating room on 09/21/2022 and underwent the above named procedures.    He was given perioperative antibiotics:  Anti-infectives (From admission, onward)    Start     Dose/Rate Route Frequency Ordered Stop   09/21/22 2000  ceFAZolin (ANCEF) IVPB 2g/100 mL premix        2 g 200 mL/hr over 30 Minutes Intravenous Every 8 hours 09/21/22 1708 09/22/22 0500   09/21/22 0600  ceFAZolin (ANCEF) IVPB 2g/100 mL premix        2 g 200 mL/hr over 30 Minutes Intravenous On call to O.R. 09/21/22 0548 09/21/22 1200     .  He was given sequential compression devices, early ambulation, and for DVT prophylaxis.  He benefited maximally from the hospital stay and there were no complications.   He was d/c once drain output acceptable, AF, VSS, intact LT P/D/1web, dressing clean/dry externally, cleared PT ambulating in TLSO.  He was d/c home after drain was d/c'd, after output has slowed appropriately.  Recent vital signs:  Vitals:   09/23/22 0334 09/23/22 0746  BP: 129/75 121/77  Pulse: 95 77  Resp: 18 18  Temp: 98.9 F (37.2 C) 98.4 F (36.9 C)  SpO2: 95% 98%    Recent laboratory studies:  Lab Results  Component Value Date   HGB 15.0 09/06/2022   HGB 14.9 05/02/2022   HGB 13.1 02/12/2014   Lab Results  Component Value Date   WBC 5.6 09/06/2022   PLT 193 09/06/2022   No results found for: "INR" Lab Results  Component Value Date   NA 141 09/06/2022   K 4.4 09/06/2022   CL 105 09/06/2022   CO2 27 09/06/2022   BUN 10 09/06/2022   CREATININE 1.15 09/06/2022   GLUCOSE 101 (H) 09/06/2022    Discharge Medications:   Allergies as of 09/23/2022       Reactions   Crestor [rosuvastatin]    Made pt feel uncomfortable         Medication List     STOP taking these medications    HYDROcodone-acetaminophen 5-325 MG tablet Commonly known as: NORCO/VICODIN       TAKE these medications    amLODipine 10 MG tablet Commonly known as: NORVASC Take 10  mg by mouth daily.   CoQ10 100 MG Caps Take 100 mg by mouth daily.   lisinopril 5 MG tablet Commonly known as: ZESTRIL Take 5 mg by mouth daily.   magnesium oxide 400 (240 Mg) MG tablet Commonly known as: MAG-OX Take 400 mg by mouth daily.   methocarbamol 750 MG tablet Commonly known as: ROBAXIN Take 1 tablet (750 mg total) by mouth every 6 (six) hours as needed for muscle spasms. What changed: Another medication with the same name was added. Make sure you understand how and when to take each.   methocarbamol 500 MG tablet Commonly known as: ROBAXIN Take 1-2 tablets (500-1,000 mg total) by mouth every 6 (six) hours as needed for muscle spasms. What changed: You were already taking a medication with the  same name, and this prescription was added. Make sure you understand how and when to take each.   omeprazole 20 MG capsule Commonly known as: PRILOSEC Take 20 mg by mouth daily.   oxyCODONE-acetaminophen 5-325 MG tablet Commonly known as: PERCOCET/ROXICET Take 1-2 tablets by mouth every 4 (four) hours as needed for severe pain.   pravastatin 40 MG tablet Commonly known as: PRAVACHOL Take 40 mg by mouth every other day.   tadalafil 5 MG tablet Commonly known as: CIALIS Take 5 mg by mouth every other day.   tamsulosin 0.4 MG Caps capsule Commonly known as: FLOMAX Take 0.4 mg by mouth daily.   tetrahydrozoline-zinc 0.05-0.25 % ophthalmic solution Commonly known as: VISINE-AC Place 1 drop into both eyes 3 (three) times daily as needed (dry eyes).   Vitamin D 50 MCG (2000 UT) Caps Take 2,000 Units by mouth daily.   vitamin E 180 MG (400 UNITS) capsule Take 400 Units by mouth daily.        Diagnostic Studies: DG Lumbar Spine 2-3 Views  Result Date: 09/21/2022 CLINICAL DATA:  Fusion at L3-4-5 EXAM: LUMBAR SPINE - 2-3 VIEW COMPARISON:  Intraoperative radiograph 09/21/2022 and lumbar MRI 03/12/2022 FINDINGS: Posterolateral rod and pedicle screw placement at the L3-L4-L5-S1 level with intervertebral spacers noted. No complicating feature observed. IMPRESSION: 1. Posterolateral rod and pedicle screw placement at the L3-L4-L5-S1 level with intervertebral spacers. Electronically Signed   By: Van Clines M.D.   On: 09/21/2022 15:18   DG C-Arm 1-60 Min-No Report  Result Date: 09/21/2022 Fluoroscopy was utilized by the requesting physician.  No radiographic interpretation.   DG C-Arm 1-60 Min-No Report  Result Date: 09/21/2022 Fluoroscopy was utilized by the requesting physician.  No radiographic interpretation.   DG C-Arm 1-60 Min-No Report  Result Date: 09/21/2022 Fluoroscopy was utilized by the requesting physician.  No radiographic interpretation.   DG C-Arm 1-60 Min-No  Report  Result Date: 09/21/2022 Fluoroscopy was utilized by the requesting physician.  No radiographic interpretation.   DG C-Arm 1-60 Min-No Report  Result Date: 09/21/2022 Fluoroscopy was utilized by the requesting physician.  No radiographic interpretation.   DG C-Arm 1-60 Min-No Report  Result Date: 09/21/2022 Fluoroscopy was utilized by the requesting physician.  No radiographic interpretation.   DG C-Arm 1-60 Min-No Report  Result Date: 09/21/2022 Fluoroscopy was utilized by the requesting physician.  No radiographic interpretation.   DG Lumbar Spine 1 View  Result Date: 09/21/2022 CLINICAL DATA:  Lateral lumbar spine done for intraoperative localization EXAM: LUMBAR SPINE - 1 VIEW COMPARISON:  MR lumbar spine done on 03/12/2022 FINDINGS: Portable cross-table view of lumbar spine was done for intraoperative localization. Degenerative changes are noted in lumbar spine, more severe at  L4-L5 and L5-S1 levels. IMPRESSION: Portable cross-table lateral view of lumbar spine was done for intraoperative localization. Electronically Signed   By: Elmer Picker M.D.   On: 09/21/2022 08:47    Disposition: Discharge disposition: 01-Home or Self Care           Signed: Jolyn Nap 09/23/2022, 8:09 AM

## 2022-09-23 NOTE — Progress Notes (Signed)
Patient is discharged from room 3C09 at this time. IV site d/c'd and instructions given to patient and spouse with understanding verbalized and all questions answered. Left unit via wheelchair with all belongings at side.

## 2022-09-23 NOTE — Progress Notes (Signed)
Physical Therapy Treatment Patient Details Name: Noah Jensen MRN: [id number] DOB: 11/26/1950 Today's Date: 09/23/2022   History of Present Illness Patient is a 72 y/o male who presents on 09/21/22 for left sided TLIF and decompression of L3-S1. PMH includes prior back sx, CVA, DM, HTN, sleep apnea.   PT Comments    Pt progressing with mobility. Today's session focused on continued gait and stair training; pt moving well with RW at supervision-level. Pt preparing for d/c home today. Reviewed educ re: precautions, positioning, TLSO wear, activity recommendations; pt reports no further questions or concerns. If to remain admitted, will continue to follow acutely.     Recommendations for follow up therapy are one component of a multi-disciplinary discharge planning process, led by the attending physician.  Recommendations may be updated based on patient status, additional functional criteria and insurance authorization.  Follow Up Recommendations  No PT follow up     Assistance Recommended at Discharge Intermittent Supervision/Assistance  Patient can return home with the following A little help with bathing/dressing/bathroom;Assistance with cooking/housework;Assist for transportation;Help with stairs or ramp for entrance   Equipment Recommendations  Rolling walker (2 wheels);BSC/3in1    Recommendations for Other Services       Precautions / Restrictions Precautions Precautions: Back Precaution Comments: pt able to recall 2/3 precautions at beginning of session Required Braces or Orthoses: Spinal Brace Spinal Brace: Thoracolumbosacral orthotic;Applied in sitting position Restrictions Weight Bearing Restrictions: No     Mobility  Bed Mobility               General bed mobility comments: received sitting EOB    Transfers Overall transfer level: Needs assistance Equipment used: Rolling walker (2 wheels) Transfers: Sit to/from Stand Sit to Stand: Supervision            General transfer comment: cues for hand placement, supervision for safety    Ambulation/Gait Ambulation/Gait assistance: Supervision Gait Distance (Feet): 140 Feet Assistive device: Rolling walker (2 wheels) Gait Pattern/deviations: Step-through pattern, Decreased stride length, Trunk flexed, Antalgic Gait velocity: Decreased     General Gait Details: slow, guarded gait with RW and supervision for safety. cues for upright posture and relaxed shoulders; cues for increased gait speed   Stairs Stairs: Yes Stairs assistance: Supervision Stair Management: One rail Left, Step to pattern Number of Stairs: 6 General stair comments: supervision for safety, initial cues for sequencing/technique   Wheelchair Mobility    Modified Rankin (Stroke Patients Only)       Balance Overall balance assessment: Needs assistance Sitting-balance support: No upper extremity supported, Feet supported Sitting balance-Leahy Scale: Good     Standing balance support: No upper extremity supported Standing balance-Leahy Scale: Fair Standing balance comment: can static stand without UE support; preference for RW                            Cognition Arousal/Alertness: Awake/alert Behavior During Therapy: WFL for tasks assessed/performed Overall Cognitive Status: Within Functional Limits for tasks assessed                                          Exercises      General Comments General comments (skin integrity, edema, etc.): pt's wife present and supportive. pt and wife able to demonstrate correct TLSO donning/doffing with min cues; other educ re: precautions, positioning, activity recommendations, therex, DVT prevention, importance of  mobility      Pertinent Vitals/Pain Pain Assessment Pain Assessment: Faces Faces Pain Scale: Hurts even more Pain Location: back Pain Descriptors / Indicators: Sore, Operative site guarding Pain Intervention(s): Monitored during  session, Premedicated before session    Home Living                          Prior Function            PT Goals (current goals can now be found in the care plan section) Progress towards PT goals: Progressing toward goals    Frequency    Min 5X/week      PT Plan Current plan remains appropriate    Co-evaluation              AM-PAC PT "6 Clicks" Mobility   Outcome Measure  Help needed turning from your back to your side while in a flat bed without using bedrails?: A Little Help needed moving from lying on your back to sitting on the side of a flat bed without using bedrails?: A Little Help needed moving to and from a bed to a chair (including a wheelchair)?: A Little Help needed standing up from a chair using your arms (e.g., wheelchair or bedside chair)?: A Little Help needed to walk in hospital room?: A Little Help needed climbing 3-5 steps with a railing? : A Little 6 Click Score: 18    End of Session Equipment Utilized During Treatment: Back brace;Gait belt Activity Tolerance: Patient tolerated treatment well Patient left: in bed;with call bell/phone within reach;with family/visitor present (sitting EOB) Nurse Communication: Mobility status PT Visit Diagnosis: Pain;Muscle weakness (generalized) (M62.81);Difficulty in walking, not elsewhere classified (R26.2)     Time: 4166-0630 PT Time Calculation (min) (ACUTE ONLY): 21 min  Charges:  $Gait Training: 8-22 mins                     Mabeline Caras, PT, DPT Acute Rehabilitation Services  Personal: [facility] Office: [place] 09/23/2022, 9:21 AM

## 2022-09-27 ENCOUNTER — Encounter (HOSPITAL_COMMUNITY): Payer: Self-pay | Admitting: Orthopedic Surgery

## 2022-09-27 MED FILL — Heparin Sodium (Porcine) Inj 1000 Unit/ML: INTRAMUSCULAR | Qty: 30 | Status: AC

## 2022-09-27 MED FILL — Sodium Chloride IV Soln 0.9%: INTRAVENOUS | Qty: 3000 | Status: AC
